# Patient Record
Sex: Male | Born: 1995 | Marital: Single | Smoking: Never smoker
Health system: Southern US, Community
[De-identification: ages and names within clinical notes are randomized; demographics above are authoritative.]

## PROBLEM LIST (undated history)

## (undated) DIAGNOSIS — Q8783 Bardet-Biedl syndrome: Secondary | ICD-10-CM

## (undated) DIAGNOSIS — F819 Developmental disorder of scholastic skills, unspecified: Secondary | ICD-10-CM

## (undated) DIAGNOSIS — F419 Anxiety disorder, unspecified: Secondary | ICD-10-CM

## (undated) DIAGNOSIS — F959 Tic disorder, unspecified: Secondary | ICD-10-CM

## (undated) DIAGNOSIS — Q8789 Other specified congenital malformation syndromes, not elsewhere classified: Secondary | ICD-10-CM

## (undated) DIAGNOSIS — E669 Obesity, unspecified: Secondary | ICD-10-CM

## (undated) HISTORY — PX: HERNIA REPAIR: SHX51

## (undated) HISTORY — DX: Tic disorder, unspecified: F95.9

## (undated) HISTORY — DX: Obesity, unspecified: E66.9

## (undated) HISTORY — DX: Anxiety disorder, unspecified: F41.9

## (undated) HISTORY — DX: Bardet-Biedl syndrome: Q87.83

## (undated) HISTORY — DX: Other specified congenital malformation syndromes, not elsewhere classified: Q87.89

## (undated) HISTORY — DX: Developmental disorder of scholastic skills, unspecified: F81.9

---
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
## 2013-01-17 ENCOUNTER — Encounter (HOSPITAL_COMMUNITY): Payer: Self-pay | Admitting: Emergency Medicine

## 2013-01-17 ENCOUNTER — Emergency Department (HOSPITAL_COMMUNITY)
Admission: EM | Admit: 2013-01-17 | Discharge: 2013-01-17 | Disposition: A | Discharge status: Home / Self Care | Payer: Medicaid Other | Attending: Emergency Medicine | Admitting: Emergency Medicine

## 2013-01-17 DIAGNOSIS — E669 Obesity, unspecified: Secondary | ICD-10-CM | POA: Insufficient documentation

## 2013-01-17 DIAGNOSIS — Z79899 Other long term (current) drug therapy: Secondary | ICD-10-CM | POA: Insufficient documentation

## 2013-01-17 DIAGNOSIS — L259 Unspecified contact dermatitis, unspecified cause: Secondary | ICD-10-CM | POA: Insufficient documentation

## 2013-01-17 DIAGNOSIS — F819 Developmental disorder of scholastic skills, unspecified: Secondary | ICD-10-CM | POA: Insufficient documentation

## 2013-01-17 DIAGNOSIS — Z880 Allergy status to penicillin: Secondary | ICD-10-CM | POA: Insufficient documentation

## 2013-01-17 DIAGNOSIS — R011 Cardiac murmur, unspecified: Secondary | ICD-10-CM | POA: Insufficient documentation

## 2013-01-17 DIAGNOSIS — F411 Generalized anxiety disorder: Secondary | ICD-10-CM | POA: Insufficient documentation

## 2013-01-17 MED ORDER — PREDNISOLONE SODIUM PHOSPHATE 15 MG/5ML PO SOLN: 40.0000 mg | Freq: Every day | ORAL | Status: AC

## 2013-01-17 MED ORDER — DIPHENHYDRAMINE HCL 12.5 MG/5ML PO SYRP: 25.0000 mg | ORAL_SOLUTION | Freq: Four times a day (QID) | ORAL | Status: DC | PRN

## 2013-01-17 NOTE — ED Notes (Signed)
Grandmother reports that she noted rash, blisters and red raised spots on lower legs 3 days ago. Rash not responsive to benadryl or calamine

## 2013-01-17 NOTE — ED Provider Notes (Signed)
CSN: [id number]     Arrival date & time 01/17/13  1351 History   First MD Initiated Contact with Patient 01/17/13 1410     Chief Complaint  Patient presents with  . Rash    rash on both legs from knees to feet. Exposure to poison oak   (Consider location/radiation/quality/duration/timing/severity/associated sxs/prior Treatment) HPI Comments: Patient with pruritic rash over bilateral feet and lower extremities that began after multiple days of clearing bleeds from land and camping in the woods. Grandmother has placed calamine lotion over the rash without relief. She has given the patient Benadryl once last night without relief. Grandmother brought patient in today concerned because she saw a blister, which she has never had before with poison oak. Denies fevers, chills, itching or swelling in the mouth and throat, shortness of breath, difficulty swallowing, drainage from the wound  Patient is a 17 y.o. male presenting with rash. The history is provided by the patient and a caregiver.  Rash Associated symptoms: no abdominal pain, no fever, no nausea, no shortness of breath, no sore throat and not vomiting     Past Medical History  Diagnosis Date  . Obesity   . Anxiety disorder   . Tic disorder   . Problems with learning   . Premature birth    Past Surgical History  Procedure Laterality Date  . Hernia repair     Family History  Problem Relation Age of Onset  . Heart disease Father     WPW  . Heart disease Mother     SVT   History  Substance Use Topics  . Smoking status: Never Smoker   . Smokeless tobacco: Not on file  . Alcohol Use: No    Review of Systems  Constitutional: Negative for fever and chills.  HENT: Negative for sore throat, mouth sores and trouble swallowing.   Respiratory: Negative for shortness of breath.   Gastrointestinal: Negative for nausea, vomiting and abdominal pain.  Skin: Positive for rash. Negative for color change.    Allergies  Penicillins and  Sulfa antibiotics  Home Medications   Current Outpatient Rx  Name  Route  Sig  Dispense  Refill  . atomoxetine (STRATTERA) 40 MG capsule   Oral   Take 40 mg by mouth 2 (two) times daily.         Marland Kitchen loratadine (CLARITIN) 5 MG chewable tablet   Oral   Chew 5 mg by mouth daily.          BP 127/64  Pulse 58  Temp(Src) 98.7 F (37.1 C) (Oral)  Resp 17  SpO2 100% Physical Exam  Nursing note and vitals reviewed. Constitutional: He appears well-developed and well-nourished. No distress.  HENT:  Head: Normocephalic and atraumatic.  Mouth/Throat: Oropharynx is clear and moist.  Neck: Neck supple.  Cardiovascular: Normal rate and regular rhythm.   Murmur heard. Pulmonary/Chest: Effort normal and breath sounds normal. No stridor. No respiratory distress. He has no wheezes. He has no rales.  Neurological: He is alert.  Skin: Rash noted. He is not diaphoretic.  Excoriated papular rash over bilateral feet most densely and also sparsely over her bilateral legs. 1 intact blister over her right dorsal foot. Lesions are nontender. There is no erythema, edema, warmth, or discharge    ED Course  Procedures (including critical care time) Labs Review Labs Reviewed - No data to display Imaging Review No results found.  MDM   1. Contact dermatitis    Patient with contact dermatitis likely from  exposure to plant. Discharged home with steroids and Benadryl. Patient is unable to swallow pills, therefore liquid has been prescribed. Grandmother advised to keep blister intact in to trauma the skin is blister ruptured spontaneously do to increase risk of infection. Discussed findings, treatment, and follow up  with patient and grandmother.  Pt given return precautions.  Grandmotehr verbalizes understanding and agrees with plan.        Trixie Dredge, PA-C 01/17/13 1437

## 2013-01-18 NOTE — ED Provider Notes (Signed)
Medical screening examination/treatment/procedure(s) were performed by non-physician practitioner and as supervising physician I was immediately available for consultation/collaboration.  Claudia Alvizo, MD 01/18/13 1643 

## 2013-03-09 ENCOUNTER — Encounter (HOSPITAL_COMMUNITY): Payer: Self-pay | Admitting: Emergency Medicine

## 2013-03-09 ENCOUNTER — Emergency Department (HOSPITAL_COMMUNITY)
Admission: EM | Admit: 2013-03-09 | Discharge: 2013-03-09 | Disposition: A | Discharge status: Home / Self Care | Payer: Medicaid Other | Attending: Emergency Medicine | Admitting: Emergency Medicine

## 2013-03-09 DIAGNOSIS — E669 Obesity, unspecified: Secondary | ICD-10-CM | POA: Insufficient documentation

## 2013-03-09 DIAGNOSIS — Z880 Allergy status to penicillin: Secondary | ICD-10-CM | POA: Insufficient documentation

## 2013-03-09 DIAGNOSIS — F411 Generalized anxiety disorder: Secondary | ICD-10-CM | POA: Insufficient documentation

## 2013-03-09 DIAGNOSIS — F39 Unspecified mood [affective] disorder: Secondary | ICD-10-CM | POA: Insufficient documentation

## 2013-03-09 DIAGNOSIS — Z79899 Other long term (current) drug therapy: Secondary | ICD-10-CM | POA: Insufficient documentation
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
## 2014-05-29 ENCOUNTER — Encounter (HOSPITAL_COMMUNITY): Payer: Self-pay | Admitting: Emergency Medicine

## 2014-05-29 ENCOUNTER — Emergency Department (INDEPENDENT_AMBULATORY_CARE_PROVIDER_SITE_OTHER)
Admission: EM | Admit: 2014-05-29 | Discharge: 2014-05-29 | Disposition: A | Discharge status: Home / Self Care | Payer: Medicaid Other | Source: Home / Self Care | Attending: Emergency Medicine | Admitting: Emergency Medicine

## 2014-05-29 ENCOUNTER — Emergency Department (INDEPENDENT_AMBULATORY_CARE_PROVIDER_SITE_OTHER): Payer: Medicaid Other

## 2014-05-29 DIAGNOSIS — T148 Other injury of unspecified body region: Secondary | ICD-10-CM

## 2014-05-29 DIAGNOSIS — T148XXA Other injury of unspecified body region, initial encounter: Secondary | ICD-10-CM

## 2014-05-29 DIAGNOSIS — S91332A Puncture wound without foreign body, left foot, initial encounter: Secondary | ICD-10-CM

## 2014-05-29 DIAGNOSIS — Z23 Encounter for immunization: Secondary | ICD-10-CM

## 2014-05-29 MED ORDER — TETANUS-DIPHTH-ACELL PERTUSSIS 5-2.5-18.5 LF-MCG/0.5 IM SUSP
0.5000 mL | Freq: Once | INTRAMUSCULAR | Status: AC
  Administered 2014-05-29: 0.5 mL via INTRAMUSCULAR

## 2014-05-29 MED ORDER — TETANUS-DIPHTH-ACELL PERTUSSIS 5-2.5-18.5 LF-MCG/0.5 IM SUSP
INTRAMUSCULAR | Status: AC
  Filled 2014-05-29: qty 0.5

## 2014-05-29 NOTE — ED Notes (Signed)
Pt states that he stepped on a nail and it went thru his boot on 05/28/2014. He is here because he doesn't know his last tetanus shot

## 2014-05-29 NOTE — Discharge Instructions (Signed)
Keep area clean and dry. Xrays normal. Monitor closely for signs of infection.  If you develop redness, swelling or drainage, please return for re-evaluation Puncture Wound A puncture wound is an injury that extends through all layers of the skin and into the tissue beneath the skin (subcutaneous tissue). Puncture wounds become infected easily because germs often enter the body and go beneath the skin during the injury. Having a deep wound with a small entrance point makes it difficult for your caregiver to adequately clean the wound. This is especially true if you have stepped on a nail and it has passed through a dirty shoe or other situations where the wound is obviously contaminated. CAUSES  Many puncture wounds involve glass, nails, splinters, fish hooks, or other objects that enter the skin (foreign bodies). A puncture wound may also be caused by a human bite or animal bite. DIAGNOSIS  A puncture wound is usually diagnosed by your history and a physical exam. You may need to have an X-ray or an ultrasound to check for any foreign bodies still in the wound. TREATMENT   Your caregiver will clean the wound as thoroughly as possible. Depending on the location of the wound, a bandage (dressing) may be applied.  Your caregiver might prescribe antibiotic medicines.  You may need a follow-up visit to check on your wound. Follow all instructions as directed by your caregiver. HOME CARE INSTRUCTIONS   Change your dressing once per day, or as directed by your caregiver. If the dressing sticks, it may be removed by soaking the area in water.  If your caregiver has given you follow-up instructions, it is very important that you return for a follow-up appointment. Not following up as directed could result in a chronic or permanent injury, pain, and disability.  Only take over-the-counter or prescription medicines for pain, discomfort, or fever as directed by your caregiver.  If you are given  antibiotics, take them as directed. Finish them even if you start to feel better. You may need a tetanus shot if:  You cannot remember when you had your last tetanus shot.  You have never had a tetanus shot. If you got a tetanus shot, your arm may swell, get red, and feel warm to the touch. This is common and not a problem. If you need a tetanus shot and you choose not to have one, there is a rare chance of getting tetanus. Sickness from tetanus can be serious. You may need a rabies shot if an animal bite caused your puncture wound. SEEK MEDICAL CARE IF:   You have redness, swelling, or increasing pain in the wound.  You have red streaks going away from the wound.  You notice a bad smell coming from the wound or dressing.  You have yellowish-white fluid (pus) coming from the wound.  You are treated with an antibiotic for infection, but the infection is not getting better.  You notice something in the wound, such as rubber from your shoe, cloth, or another object.  You have a fever.  You have severe pain.  You have difficulty breathing.  You feel dizzy or faint.  You cannot stop vomiting.  You lose feeling, develop numbness, or cannot move a limb below the wound.  Your symptoms worsen. MAKE SURE YOU:  Understand these instructions.  Will watch your condition.  Will get help right away if you are not doing well or get worse. Document Released: 02/10/2005 Document Revised: 07/26/2011 Document Reviewed: 10/20/2010 Exit[employer] Patient Information 2015  [employer]. This information is not intended to replace advice given to you by your health care provider. Make sure you discuss any questions you have with your health care provider.

## 2014-05-29 NOTE — ED Provider Notes (Signed)
CSN: [id number]     Arrival date & time 05/29/14  1335 History   First MD Initiated Contact with Patient 05/29/14 1352     Chief Complaint  Patient presents with  . Foot Injury   (Consider location/radiation/quality/duration/timing/severity/associated sxs/prior Treatment) HPI Comments: Patient states he was walking outdoors with boots on yesterday evening and stepped on a nail that went through the sole of his boot and caused a small puncture wound on the plantar surface of the foot at the base of his left great toe. Last tetanus booster: unknown  Patient is a 19 y.o. male presenting with foot injury. The history is provided by the patient and a relative.  Foot Injury Location:  Foot Time since incident:  1 day Injury: yes   Foot location:  Sole of L foot Tetanus status:  Out of date Prior injury to area:  No   Past Medical History  Diagnosis Date  . Obesity   . Anxiety disorder   . Tic disorder   . Problems with learning   . Premature birth    Past Surgical History  Procedure Laterality Date  . Hernia repair     Family History  Problem Relation Age of Onset  . Heart disease Father     WPW  . Heart disease Mother     SVT   History  Substance Use Topics  . Smoking status: Never Smoker   . Smokeless tobacco: Not on file  . Alcohol Use: No    Review of Systems  All other systems reviewed and are negative.   Allergies  Penicillins and Sulfa antibiotics  Home Medications   Prior to Admission medications   Medication Sig Start Date End Date Taking? Authorizing Provider  ALPRAZolam (XANAX) 0.25 MG tablet Take 0.25 mg by mouth 2 (two) times daily.   Yes Historical Provider, MD  ARIPiprazole (ABILIFY) 5 MG tablet Take 5 mg by mouth daily.   Yes Historical Provider, MD  loratadine (CLARITIN) 5 MG chewable tablet Chew 5 mg by mouth daily.   Yes Historical Provider, MD  traZODone (DESYREL) 50 MG tablet Take 50 mg by mouth at bedtime.   Yes Historical Provider, MD   dexmethylphenidate (FOCALIN XR) 5 MG 24 hr capsule Take 5 mg by mouth daily.    Historical Provider, MD   BP 151/66 mmHg  Pulse 77  Temp(Src) 97.8 F (36.6 C) (Oral)  Resp 18  SpO2 99% Physical Exam  Constitutional: He is oriented to person, place, and time. He appears well-developed and well-nourished. No distress.  HENT:  Head: Normocephalic and atraumatic.  Cardiovascular: Normal rate.   Pulmonary/Chest: Effort normal.  Musculoskeletal: Normal range of motion.  +small superficial puncture wound at plantar surface of left foot at base of left great toe.   Neurological: He is alert and oriented to person, place, and time.  Psychiatric: His speech is normal and behavior is normal. His affect is blunt.  Nursing note and vitals reviewed.   ED Course  Procedures (including critical care time) Labs Review Labs Reviewed - No data to display  Imaging Review Dg Foot Complete Left  05/29/2014   CLINICAL DATA:  Stepped on nail yesterday. Wound is plantar at the base of the great toe.  EXAM: LEFT FOOT - COMPLETE 3+ VIEW  COMPARISON:  None.  FINDINGS: There is no evidence of fracture or dislocation. There is no evidence of arthropathy or other focal bone abnormality. Soft tissues are unremarkable.  IMPRESSION: Negative.   Electronically Signed  By: Signa Kell M.D.   On: 05/29/2014 15:06     MDM   1. Puncture wound of foot, left, initial encounter   2. Puncture wound   Films unremarkable Patient received Tdap booster while at [facility] No clinical evidence of retained FB or soft tissue infection. Will advise family regarding wound care and signs/symptoms of infection along with reasons for return.     Ria Clock, [place] 05/29/14 1515

## 2014-10-28 ENCOUNTER — Emergency Department (HOSPITAL_COMMUNITY)
Admission: EM | Admit: 2014-10-28 | Discharge: 2014-10-31 | Disposition: A | Discharge status: Home / Self Care | Payer: Medicaid Other | Attending: Emergency Medicine | Admitting: Emergency Medicine

## 2014-10-28 ENCOUNTER — Encounter (HOSPITAL_COMMUNITY): Payer: Self-pay

## 2014-10-28 ENCOUNTER — Ambulatory Visit (HOSPITAL_COMMUNITY)
Admission: RE | Admit: 2014-10-28 | Discharge: 2014-10-28 | Disposition: A | Discharge status: Home / Self Care | Payer: Medicaid Other | Attending: Psychiatry | Admitting: Psychiatry

## 2014-10-28 DIAGNOSIS — F911 Conduct disorder, childhood-onset type: Secondary | ICD-10-CM | POA: Diagnosis not present

## 2014-10-28 DIAGNOSIS — E669 Obesity, unspecified: Secondary | ICD-10-CM | POA: Insufficient documentation

## 2014-10-28 DIAGNOSIS — F1310 Sedative, hypnotic or anxiolytic abuse, uncomplicated: Secondary | ICD-10-CM | POA: Insufficient documentation

## 2014-10-28 DIAGNOSIS — R45850 Homicidal ideations: Secondary | ICD-10-CM

## 2014-10-28 DIAGNOSIS — F88 Other disorders of psychological development: Secondary | ICD-10-CM | POA: Insufficient documentation

## 2014-10-28 DIAGNOSIS — Z79899 Other long term (current) drug therapy: Secondary | ICD-10-CM | POA: Diagnosis not present

## 2014-10-28 DIAGNOSIS — F819 Developmental disorder of scholastic skills, unspecified: Secondary | ICD-10-CM

## 2014-10-28 DIAGNOSIS — Z880 Allergy status to penicillin: Secondary | ICD-10-CM | POA: Insufficient documentation

## 2014-10-28 DIAGNOSIS — F333 Major depressive disorder, recurrent, severe with psychotic symptoms: Secondary | ICD-10-CM

## 2014-10-28 DIAGNOSIS — F411 Generalized anxiety disorder: Secondary | ICD-10-CM | POA: Diagnosis not present

## 2014-10-28 DIAGNOSIS — R4689 Other symptoms and signs involving appearance and behavior: Secondary | ICD-10-CM

## 2014-10-28 DIAGNOSIS — R45851 Suicidal ideations: Secondary | ICD-10-CM | POA: Diagnosis present

## 2014-10-28 LAB — CBC
HEMATOCRIT: 42.3 % (ref 39.0–52.0)
HEMOGLOBIN: 14.6 g/dL (ref 13.0–17.0)
MCH: 30.5 pg (ref 26.0–34.0)
MCHC: 34.5 g/dL (ref 30.0–36.0)
MCV: 88.5 fL (ref 78.0–100.0)
Platelets: 230 10*3/uL (ref 150–400)
RBC: 4.78 MIL/uL (ref 4.22–5.81)
RDW: 12.6 % (ref 11.5–15.5)
WBC: 9.5 10*3/uL (ref 4.0–10.5)

## 2014-10-28 LAB — COMPREHENSIVE METABOLIC PANEL
ALBUMIN: 4.6 g/dL (ref 3.5–5.0)
ALT: 96 U/L — ABNORMAL HIGH (ref 17–63)
ANION GAP: 8 (ref 5–15)
AST: 48 U/L — ABNORMAL HIGH (ref 15–41)
Alkaline Phosphatase: 94 U/L (ref 38–126)
BILIRUBIN TOTAL: 0.7 mg/dL (ref 0.3–1.2)
BUN: 9 mg/dL (ref 6–20)
CALCIUM: 9.4 mg/dL (ref 8.9–10.3)
CHLORIDE: 106 mmol/L (ref 101–111)
CO2: 26 mmol/L (ref 22–32)
CREATININE: 0.95 mg/dL (ref 0.61–1.24)
GFR calc Af Amer: >60 mL/min (ref >60)
GFR calc non Af Amer: >60 mL/min (ref >60)
Glucose, Bld: 104 mg/dL — ABNORMAL HIGH (ref 65–99)
POTASSIUM: 3.5 mmol/L (ref 3.5–5.1)
SODIUM: 140 mmol/L (ref 135–145)
Total Protein: 7.3 g/dL (ref 6.5–8.1)

## 2014-10-28 LAB — RAPID URINE DRUG SCREEN, HOSP PERFORMED
AMPHETAMINES: NOT DETECTED
BENZODIAZEPINES: POSITIVE — AB
Barbiturates: NOT DETECTED
Cocaine: NOT DETECTED
OPIATES: NOT DETECTED
Tetrahydrocannabinol: NOT DETECTED

## 2014-10-28 LAB — ACETAMINOPHEN LEVEL

## 2014-10-28 LAB — SALICYLATE LEVEL: Salicylate Lvl: <4 mg/dL (ref 2.8–30.0)

## 2014-10-28 LAB — ETHANOL: Alcohol, Ethyl (B): <5 mg/dL (ref <5)

## 2014-10-28 NOTE — ED Notes (Signed)
Pt was brought to the ER by a [facility] tech and it was told that he DEFINITELY had a bed for him at [facility], the family is also under the assumption that was the case, when talking to the [facility] and the assessment team at [facility], he was never approved for a bed at [facility] because he has MR and they are looking for placement for him. Charge RN spoke to the [facility] and it was discussed that it was also inappropriate for him to be in the ER and why we were told that he had a bed available.
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
## 2014-10-28 NOTE — ED Notes (Signed)
Pt belongings are with his family members

## 2014-10-28 NOTE — ED Notes (Signed)
Pt visiting with family before coming back to [facility]

## 2014-10-28 NOTE — Progress Notes (Addendum)
Patient was referred at: [facility] - left voicemail. [facility] - left voicemail. [facility] - per Kenel, if pt's IQ is over 60 or if pt is high functioning, patient can be referred.  At capacity: [facility] - per Vernon Prey spoke with patient's grandmother Mellody Memos inquiring if pt's MR/IQ score can be provided.  Per pt's grandmother, she got home and opened the psychological assessment but has not been able to find the MR number.  Pt's grandmother stated that she will call back Clinical research associate at [facility].  This Clinical research associate awaiting call back from pt's grandmother. Writer left a note for the am CSW that family member will be calling to leave patient's MR score.  CSW will continue to follow up.  Melbourne Abts, LCSWA Disposition staff 10/28/2014 10:36 PM

## 2014-10-28 NOTE — ED Notes (Signed)
Pt needs to be medically cleared for [facility], he has a ready bed there, pt has anger issues and is SI/HI

## 2014-10-28 NOTE — ED Provider Notes (Signed)
CSN: [id number]     Arrival date & time 10/28/14  1948 History   First MD Initiated Contact with Patient 10/28/14 2043     Chief Complaint  Patient presents with  . Suicidal     (Consider location/radiation/quality/duration/timing/severity/associated sxs/prior Treatment) HPI The patient is sent for medical clearance for behavioral health admission. The patient reports he has anger problems and sometimes loses his temper and comes close to hurting people. Family members are present with him and reports that he at times become violent and aggressive and lacks control. The patient endorses at times thinking of hurting others and or himself. Family members present state he has not been medically unwell. Past Medical History  Diagnosis Date  . Obesity   . Anxiety disorder   . Tic disorder   . Problems with learning   . Premature birth    Past Surgical History  Procedure Laterality Date  . Hernia repair     Family History  Problem Relation Age of Onset  . Heart disease Father     WPW  . Heart disease Mother     SVT   History  Substance Use Topics  . Smoking status: Never Smoker   . Smokeless tobacco: Not on file  . Alcohol Use: No    Review of Systems 10 Systems reviewed and are negative for acute change except as noted in the HPI.    Allergies  Penicillins and Sulfa antibiotics  Home Medications   Prior to Admission medications   Medication Sig Start Date End Date Taking? Authorizing Provider  ALPRAZolam Prudy Feeler) 0.5 MG tablet Take 0.5 mg by mouth 2 (two) times daily.   Yes Historical Provider, MD  ARIPiprazole (ABILIFY) 10 MG tablet Take 10 mg by mouth daily.   Yes Historical Provider, MD  DHA-EPA-VITAMIN E PO Take 1,000 mg by mouth daily.   Yes Historical Provider, MD  loratadine (CLARITIN) 10 MG tablet Take 10 mg by mouth daily.   Yes Historical Provider, MD  traZODone (DESYREL) 50 MG tablet Take 50 mg by mouth at bedtime.   Yes Historical Provider, MD   BP 127/59  mmHg  Pulse 67  Temp(Src) 98.2 F (36.8 C) (Oral)  Resp 20  SpO2 99% Physical Exam  Constitutional: He is oriented to person, place, and time. He appears well-developed and well-nourished.  HENT:  Head: Normocephalic and atraumatic.  Eyes: EOM are normal. Pupils are equal, round, and reactive to light.  Neck: Neck supple.  Cardiovascular: Normal rate, regular rhythm, normal heart sounds and intact distal pulses.   Pulmonary/Chest: Effort normal and breath sounds normal.  Abdominal: Soft. Bowel sounds are normal. He exhibits no distension. There is no tenderness.  Musculoskeletal: Normal range of motion. He exhibits no edema.  Neurological: He is alert and oriented to person, place, and time. He has normal strength. Coordination normal. GCS eye subscore is 4. GCS verbal subscore is 5. GCS motor subscore is 6.  Skin: Skin is warm, dry and intact.  Psychiatric:  Patient's affect is flat but cooperative.    ED Course  Procedures (including critical care time) Labs Review Labs Reviewed  ACETAMINOPHEN LEVEL - Abnormal; Notable for the following:    Acetaminophen (Tylenol), Serum <10 (*)    All other components within normal limits  COMPREHENSIVE METABOLIC PANEL - Abnormal; Notable for the following:    Glucose, Bld 104 (*)    AST 48 (*)    ALT 96 (*)    All other components within normal limits  URINE RAPID DRUG SCREEN, HOSP PERFORMED - Abnormal; Notable for the following:    Benzodiazepines POSITIVE (*)    All other components within normal limits  CBC  ETHANOL  SALICYLATE LEVEL    Imaging Review No results found.   EKG Interpretation None      MDM   Final diagnoses:  Homicidal ideation  Aggressive behavior  Cognitive developmental delay   The patient is medically cleared for psychiatric admission. He and family members deny any recent illness or any localizing physical complaints. The patient does have behavioral\cognitive delays and has had escalating behaviors  that are threatening to others as well as ideations of self-harm.    Arby Barrette, MD 10/29/14 [phone]

## 2014-10-28 NOTE — ED Notes (Signed)
Bed: WBH36 Expected date:  Expected time:  Means of arrival:  Comments: Triage 4 
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
## 2014-10-29 DIAGNOSIS — F333 Major depressive disorder, recurrent, severe with psychotic symptoms: Secondary | ICD-10-CM

## 2014-10-29 MED ORDER — LORATADINE 10 MG PO TABS
10.0000 mg | ORAL_TABLET | Freq: Every day | ORAL | Status: DC
  Administered 2014-10-29 – 2014-10-31 (×3): 10 mg via ORAL
  Filled 2014-10-29 (×3): qty 1

## 2014-10-29 MED ORDER — TRAZODONE HCL 50 MG PO TABS
50.0000 mg | ORAL_TABLET | Freq: Every day | ORAL | Status: DC
  Administered 2014-10-29 – 2014-10-30 (×2): 50 mg via ORAL
  Filled 2014-10-29 (×2): qty 1

## 2014-10-29 MED ORDER — ARIPIPRAZOLE 10 MG PO TABS
10.0000 mg | ORAL_TABLET | Freq: Every day | ORAL | Status: DC
Start: 2014-10-29 — End: 2014-10-31
  Administered 2014-10-29 – 2014-10-31 (×3): 10 mg via ORAL
  Filled 2014-10-29 (×3): qty 1

## 2014-10-29 MED ORDER — ALPRAZOLAM 0.5 MG PO TABS
0.5000 mg | ORAL_TABLET | Freq: Two times a day (BID) | ORAL | Status: DC
  Administered 2014-10-29 – 2014-10-31 (×5): 0.5 mg via ORAL
  Filled 2014-10-29 (×5): qty 1

## 2014-10-29 NOTE — Progress Notes (Signed)
At this time, pt does not have any formal diagnosis for MR. Pt last developmental diagnostic assessment was at age 19 with no full scale IQ.   Olga Coaster, LCSW  Clinical Social Work  [employer] [phone]

## 2014-10-29 NOTE — Progress Notes (Addendum)
Patient referral was followed up at: [facility] - per Alcario Drought, referral is still in stack of referrals, - will give you a call if there is an appropriate bed available for patient. [facility] - left voicemail.  [facility] - per Mickeal Skinner, can't take pt's insurance.  Patient has been referred to: [facility], and [facility].  Melbourne Abts, LCSWA Disposition staff 10/29/2014 9:23 PM

## 2014-10-29 NOTE — Progress Notes (Signed)
CSW met with pt and mom at bedside. Patient informed CSW that he does not have any questions at this time.  Mom states that she and the pt's grandmother would like to speak with CSW. CSW met with mom and grandmother. Grandmother is currently the guardian of pt. She states that she has been the guardian of the pt for a year. Patient currently lives with grandmom. Grandmother states that the pt's most recent psychological evaluation was done last year at [facility]. Grandmother allowed CSW to copy the psychological.  Grandmother informed CSW that the pt cannot count money, read very well, and that he is also not able to got to stores alone. She states that the pt must have someone with him at all time. Grandmother informed CSW that the pt has poor eye sight and is going blind.6  Grandmother informed CSW that she does not want the pt to be hospitalized for inpatient treatment outside of [place] or [facility].   Willette Brace [phone] ED CSW 10/29/2014 10:53 PM

## 2014-10-29 NOTE — ED Notes (Signed)
Pt AAO x 3, no distress noted, calm & cooperative, visiting with family at present.  Monitoring for safety, Q 15 min checks in effect at present.

## 2014-10-29 NOTE — Consult Note (Signed)
[facility] Consult   Reason for Consult:  Severe Recurrent Major depression with Psychosis, Agitation, Aggression Referring Physician:  EDP Patient Identification: Jerry Casey. MRN:  [id number] Principal Diagnosis: Severe recurrent major depressive disorder with psychotic features Diagnosis:   Patient Active Problem List   Diagnosis Date Noted  . Severe recurrent major depressive disorder with psychotic features [F33.3] 10/29/2014    Priority: High  . Palpitations [R00.2] 06/23/2011  . Chest pain [786.5] 06/23/2011    Total Time spent with patient: 1 hour  Subjective:   Jerry Casey. is a 19 y.o. male patient admitted with Severe Recurrent Major depression with Psychosis, Agitation, Aggression.  HPI:  Caucasian male, 19 years old was brought in by his mother and grand mother for anger and agitation.  Patient reports that he hears voices calling him bad names.  Patient reports that he was angry ad stated that he wanted to kill himself.   He reports that his anger increases when people around him make fun of him and calls him names.  Patient, it documented want to kill his father for not wanting a relationship with him.  Patient reports poor sleep and appetite.  He denies SI/HIVH but reports auditory hallucination with the voices calling him bad names.  He also reports Paranoia suspecting people are out to get him.   Patient is accepted for admission and we will be seeking placement at any facility with available inpatient Psychiatric unit.  HPI Elements:   Location:  Major depressive disorder, recurrent with Psychosis. Quality:  severe. Severity:  severe. Timing:  acute. Duration:  sudden. Context:  Brought in by [name] mother and his mother for agitataion.  Past Medical History:  Past Medical History  Diagnosis Date  . Obesity   . Anxiety disorder   . Tic disorder   . Problems with learning   . Premature birth     Past Surgical History  Procedure Laterality  Date  . Hernia repair     Family History:  Family History  Problem Relation Age of Onset  . Heart disease Father     WPW  . Heart disease Mother     SVT   Social History:  History  Alcohol Use No     History  Drug Use Not on file    History   Social History  . Marital Status: Single    Spouse Name: N/A  . Number of Children: N/A  . Years of Education: N/A   Occupational History  .      Student   Social History Main Topics  . Smoking status: Never Smoker   . Smokeless tobacco: Not on file  . Alcohol Use: No  . Drug Use: Not on file  . Sexual Activity: Not on file   Other Topics Concern  . None   Social History Narrative   Additional Social History:                          Allergies:   Allergies  Allergen Reactions  . Penicillins Hives  . Sulfa Antibiotics Hives    Labs:  Results for orders placed or performed during the hospital encounter of 10/28/14 (from the past 48 hour(s))  Acetaminophen level     Status: Abnormal   Collection Time: 10/28/14  9:21 PM  Result Value Ref Range   Acetaminophen (Tylenol), Serum <10 (L) 10 - 30 ug/mL    Comment:  THERAPEUTIC CONCENTRATIONS VARY SIGNIFICANTLY. A RANGE OF 10-30 ug/mL MAY BE AN EFFECTIVE CONCENTRATION FOR MANY PATIENTS. HOWEVER, SOME ARE BEST TREATED AT CONCENTRATIONS OUTSIDE THIS RANGE. ACETAMINOPHEN CONCENTRATIONS >150 ug/mL AT 4 HOURS AFTER INGESTION AND >50 ug/mL AT 12 HOURS AFTER INGESTION ARE OFTEN ASSOCIATED WITH TOXIC REACTIONS.   CBC     Status: None   Collection Time: 10/28/14  9:21 PM  Result Value Ref Range   WBC 9.5 4.0 - 10.5 K/uL   RBC 4.78 4.22 - 5.81 MIL/uL   Hemoglobin 14.6 13.0 - 17.0 g/dL   HCT 59.9 23.4 - 14.4 %   MCV 88.5 78.0 - 100.0 fL   MCH 30.5 26.0 - 34.0 pg   MCHC 34.5 30.0 - 36.0 g/dL   RDW 36.0 16.5 - 80.0 %   Platelets 230 150 - 400 K/uL  Comprehensive metabolic panel     Status: Abnormal   Collection Time: 10/28/14  9:21 PM  Result Value  Ref Range   Sodium 140 135 - 145 mmol/L   Potassium 3.5 3.5 - 5.1 mmol/L   Chloride 106 101 - 111 mmol/L   CO2 26 22 - 32 mmol/L   Glucose, Bld 104 (H) 65 - 99 mg/dL   BUN 9 6 - 20 mg/dL   Creatinine, Ser 6.34 0.61 - 1.24 mg/dL   Calcium 9.4 8.9 - 94.9 mg/dL   Total Protein 7.3 6.5 - 8.1 g/dL   Albumin 4.6 3.5 - 5.0 g/dL   AST 48 (H) 15 - 41 U/L   ALT 96 (H) 17 - 63 U/L   Alkaline Phosphatase 94 38 - 126 U/L   Total Bilirubin 0.7 0.3 - 1.2 mg/dL   GFR calc non Af Amer >60 >60 mL/min   GFR calc Af Amer >60 >60 mL/min    Comment: (NOTE) The eGFR has been calculated using the CKD EPI equation. This calculation has not been validated in all clinical situations. eGFR's persistently <60 mL/min signify possible Chronic Kidney Disease.    Anion gap 8 5 - 15  Ethanol (ETOH)     Status: None   Collection Time: 10/28/14  9:21 PM  Result Value Ref Range   Alcohol, Ethyl (B) <5 <5 mg/dL    Comment:        LOWEST DETECTABLE LIMIT FOR SERUM ALCOHOL IS 5 mg/dL FOR MEDICAL PURPOSES ONLY   Salicylate level     Status: None   Collection Time: 10/28/14  9:21 PM  Result Value Ref Range   Salicylate Lvl <4.0 2.8 - 30.0 mg/dL  Urine rapid drug screen (hosp performed)not at [facility]     Status: Abnormal   Collection Time: 10/28/14  9:36 PM  Result Value Ref Range   Opiates NONE DETECTED NONE DETECTED   Cocaine NONE DETECTED NONE DETECTED   Benzodiazepines POSITIVE (A) NONE DETECTED   Amphetamines NONE DETECTED NONE DETECTED   Tetrahydrocannabinol NONE DETECTED NONE DETECTED   Barbiturates NONE DETECTED NONE DETECTED    Comment:        DRUG SCREEN FOR MEDICAL PURPOSES ONLY.  IF CONFIRMATION IS NEEDED FOR ANY PURPOSE, NOTIFY LAB WITHIN 5 DAYS.        LOWEST DETECTABLE LIMITS FOR URINE DRUG SCREEN Drug Class       Cutoff (ng/mL) Amphetamine      1000 Barbiturate      200 Benzodiazepine   200 Tricyclics       300 Opiates          300 Cocaine  300 THC              50      Vitals: Blood pressure 127/59, pulse 67, temperature 98.2 F (36.8 C), temperature source Oral, resp. rate 20, SpO2 99 %.  Risk to Self: Is patient at risk for suicide?: Yes Risk to Others:   Prior Inpatient Therapy:   Prior Outpatient Therapy:    Current Facility-Administered Medications  Medication Dose Route Frequency Provider Last Rate Last Dose  . ALPRAZolam (XANAX) tablet 0.5 mg  0.5 mg Oral BID Delfin Gant, NP      . ARIPiprazole (ABILIFY) tablet 10 mg  10 mg Oral Daily Delfin Gant, NP      . loratadine (CLARITIN) tablet 10 mg  10 mg Oral Daily Delfin Gant, NP      . traZODone (DESYREL) tablet 50 mg  50 mg Oral QHS Delfin Gant, NP       Current Outpatient Prescriptions  Medication Sig Dispense Refill  . ALPRAZolam (XANAX) 0.5 MG tablet Take 0.5 mg by mouth 2 (two) times daily.    . ARIPiprazole (ABILIFY) 10 MG tablet Take 10 mg by mouth daily.    . DHA-EPA-VITAMIN E PO Take 1,000 mg by mouth daily.    Marland Kitchen loratadine (CLARITIN) 10 MG tablet Take 10 mg by mouth daily.    . traZODone (DESYREL) 50 MG tablet Take 50 mg by mouth at bedtime.      Musculoskeletal: Strength & Muscle Tone: within normal limits Gait & Station: normal Patient leans: N/A  Psychiatric Specialty Exam: Physical Exam  Review of Systems  Constitutional: Negative.   HENT: Negative.   Eyes: Negative.   Cardiovascular: Negative.   Gastrointestinal: Negative.   Genitourinary: Negative.   Musculoskeletal: Negative.   Skin: Negative.   Neurological: Negative.     Blood pressure 127/59, pulse 67, temperature 98.2 F (36.8 C), temperature source Oral, resp. rate 20, SpO2 99 %.There is no weight on file to calculate BMI.  General Appearance: Casual and Fairly Groomed  Eye Contact::  Good  Speech:  Clear and Coherent and Normal Rate  Volume:  Normal  Mood:  Anxious and Depressed  Affect:  Congruent and Depressed  Thought Process:  Coherent, Goal Directed and Intact   Orientation:  Full (Time, Place, and Person)  Thought Content:  WDL  Suicidal Thoughts:  No  Homicidal Thoughts:  No  Memory:  Immediate;   Good Recent;   Good Remote;   Fair  Judgement:  Poor  Insight:  Shallow  Psychomotor Activity:  Normal  Concentration:  Good  Recall:  NA  Fund of Knowledge:Fair  Language: Good  Akathisia:  NA  Handed:  Right  AIMS (if indicated):     Assets:  Desire for Improvement  ADL's:  Intact  Cognition: WNL  Sleep:      Medical Decision Making: Review of Psycho-Social Stressors (1), Established Problem, Worsening (2), Review of Medication Regimen & Side Effects (2) and Review of New Medication or Change in Dosage (2)  Treatment Plan Summary: Daily contact with patient to assess and evaluate symptoms and progress in treatment and Medication management  Plan:  Recommend psychiatric Inpatient admission when medically cleared.  Resume home medications,. Disposition: [facility]    PMHNP-BC 10/29/2014 1:25 PM Patient seen face-to-face for psychiatric evaluation, chart reviewed and case discussed with the physician extender and developed treatment plan. Reviewed the information documented and agree with the treatment plan. Corena Pilgrim, MD

## 2014-10-29 NOTE — ED Notes (Signed)
Patient is quiet and cooperative.  He has remained in room, only to come out and use the bathroom.

## 2014-10-29 NOTE — Progress Notes (Signed)
Grandmother/ Kendal Hymen [phone] Esmond Camper [phone] ED CSW 10/29/2014 10:57 PM

## 2014-10-30 DIAGNOSIS — R4689 Other symptoms and signs involving appearance and behavior: Secondary | ICD-10-CM | POA: Insufficient documentation

## 2014-10-30 NOTE — ED Notes (Signed)
Pt AAO x 3, no distress noted, family at bedside, pt took shower, comfort measures given.  Pt calm & cooperative at present.  Monitoring for safety, Q 15 min checks in effect.

## 2014-10-30 NOTE — Progress Notes (Signed)
ED CM reviewed SAPPU progression responses with covering SW, S Kidd  

## 2014-10-30 NOTE — Consult Note (Signed)
[facility] Consult   Reason for Consult:  Severe Recurrent Major depression with Psychosis, Agitation, Aggression Referring Physician:  EDP Patient Identification: Jerry Casey. MRN:  [id number] Principal Diagnosis: Severe recurrent major depressive disorder with psychotic features Diagnosis:   Patient Active Problem List   Diagnosis Date Noted  . Severe recurrent major depressive disorder with psychotic features [F33.3] 10/29/2014    Priority: High  . Palpitations [R00.2] 06/23/2011  . Chest pain [786.5] 06/23/2011    Total Time spent with patient: 1 hour  Subjective:   Jerry Casey. is a 19 y.o. male patient admitted with Severe Recurrent Major depression with Psychosis, Agitation, Aggression.  HPI:  Caucasian male, 19 years old was brought in by his mother and grand mother for anger and agitation.  Patient reports that he hears voices calling him bad names.  Patient reports that he was angry ad stated that he wanted to kill himself.   He reports that his anger increases when people around him make fun of him and calls him names.  Patient, it documented want to kill his father for not wanting a relationship with him.  Patient reports poor sleep and appetite.  He denies SI/HIVH but reports auditory hallucination with the voices calling him bad names.  He also reports Paranoia suspecting people are out to get him.   Patient is accepted for admission and we will be seeking placement at any facility with available inpatient Psychiatric unit.  Patient denies SI/HI [facility] but still is having auditory hallucination with voices calling him "bad names" but did not explain further.  We will continue to seek placement at any inpatient Psychiatric unit with available bed.  HPI Elements:   Location:  Major depressive disorder, recurrent with Psychosis. Quality:  severe. Severity:  severe. Timing:  acute. Duration:  sudden. Context:  Brought in by [place] mother and his mother for  agitataion.  Past Medical History:  Past Medical History  Diagnosis Date  . Obesity   . Anxiety disorder   . Tic disorder   . Problems with learning   . Premature birth     Past Surgical History  Procedure Laterality Date  . Hernia repair     Family History:  Family History  Problem Relation Age of Onset  . Heart disease Father     WPW  . Heart disease Mother     SVT   Social History:  History  Alcohol Use No     History  Drug Use Not on file    History   Social History  . Marital Status: Single    Spouse Name: N/A  . Number of Children: N/A  . Years of Education: N/A   Occupational History  .      Student   Social History Main Topics  . Smoking status: Never Smoker   . Smokeless tobacco: Not on file  . Alcohol Use: No  . Drug Use: Not on file  . Sexual Activity: Not on file   Other Topics Concern  . None   Social History Narrative   Additional Social History:    Allergies:   Allergies  Allergen Reactions  . Penicillins Hives  . Sulfa Antibiotics Hives    Labs:  Results for orders placed or performed during the hospital encounter of 10/28/14 (from the past 48 hour(s))  Acetaminophen level     Status: Abnormal   Collection Time: 10/28/14  9:21 PM  Result Value Ref Range   Acetaminophen (Tylenol), Serum <  10 (L) 10 - 30 ug/mL    Comment:        THERAPEUTIC CONCENTRATIONS VARY SIGNIFICANTLY. A RANGE OF 10-30 ug/mL MAY BE AN EFFECTIVE CONCENTRATION FOR MANY PATIENTS. HOWEVER, SOME ARE BEST TREATED AT CONCENTRATIONS OUTSIDE THIS RANGE. ACETAMINOPHEN CONCENTRATIONS >150 ug/mL AT 4 HOURS AFTER INGESTION AND >50 ug/mL AT 12 HOURS AFTER INGESTION ARE OFTEN ASSOCIATED WITH TOXIC REACTIONS.   CBC     Status: None   Collection Time: 10/28/14  9:21 PM  Result Value Ref Range   WBC 9.5 4.0 - 10.5 K/uL   RBC 4.78 4.22 - 5.81 MIL/uL   Hemoglobin 14.6 13.0 - 17.0 g/dL   HCT 42.3 39.0 - 52.0 %   MCV 88.5 78.0 - 100.0 fL   MCH 30.5 26.0 - 34.0  pg   MCHC 34.5 30.0 - 36.0 g/dL   RDW 12.6 11.5 - 15.5 %   Platelets 230 150 - 400 K/uL  Comprehensive metabolic panel     Status: Abnormal   Collection Time: 10/28/14  9:21 PM  Result Value Ref Range   Sodium 140 135 - 145 mmol/L   Potassium 3.5 3.5 - 5.1 mmol/L   Chloride 106 101 - 111 mmol/L   CO2 26 22 - 32 mmol/L   Glucose, Bld 104 (H) 65 - 99 mg/dL   BUN 9 6 - 20 mg/dL   Creatinine, Ser 0.95 0.61 - 1.24 mg/dL   Calcium 9.4 8.9 - 10.3 mg/dL   Total Protein 7.3 6.5 - 8.1 g/dL   Albumin 4.6 3.5 - 5.0 g/dL   AST 48 (H) 15 - 41 U/L   ALT 96 (H) 17 - 63 U/L   Alkaline Phosphatase 94 38 - 126 U/L   Total Bilirubin 0.7 0.3 - 1.2 mg/dL   GFR calc non Af Amer >60 >60 mL/min   GFR calc Af Amer >60 >60 mL/min    Comment: (NOTE) The eGFR has been calculated using the CKD EPI equation. This calculation has not been validated in all clinical situations. eGFR's persistently <60 mL/min signify possible Chronic Kidney Disease.    Anion gap 8 5 - 15  Ethanol (ETOH)     Status: None   Collection Time: 10/28/14  9:21 PM  Result Value Ref Range   Alcohol, Ethyl (B) <5 <5 mg/dL    Comment:        LOWEST DETECTABLE LIMIT FOR SERUM ALCOHOL IS 5 mg/dL FOR MEDICAL PURPOSES ONLY   Salicylate level     Status: None   Collection Time: 10/28/14  9:21 PM  Result Value Ref Range   Salicylate Lvl <6.3 2.8 - 30.0 mg/dL  Urine rapid drug screen (hosp performed)not at [facility]     Status: Abnormal   Collection Time: 10/28/14  9:36 PM  Result Value Ref Range   Opiates NONE DETECTED NONE DETECTED   Cocaine NONE DETECTED NONE DETECTED   Benzodiazepines POSITIVE (A) NONE DETECTED   Amphetamines NONE DETECTED NONE DETECTED   Tetrahydrocannabinol NONE DETECTED NONE DETECTED   Barbiturates NONE DETECTED NONE DETECTED    Comment:        DRUG SCREEN FOR MEDICAL PURPOSES ONLY.  IF CONFIRMATION IS NEEDED FOR ANY PURPOSE, NOTIFY LAB WITHIN 5 DAYS.        LOWEST DETECTABLE LIMITS FOR URINE DRUG  SCREEN Drug Class       Cutoff (ng/mL) Amphetamine      1000 Barbiturate      200 Benzodiazepine   016 Tricyclics  300 Opiates          300 Cocaine          300 THC              50     Vitals: Blood pressure 124/59, pulse 60, temperature 97.8 F (36.6 C), temperature source Oral, resp. rate 18, SpO2 100 %.  Risk to Self: Is patient at risk for suicide?: Yes Risk to Others:   Prior Inpatient Therapy:   Prior Outpatient Therapy:    Current Facility-Administered Medications  Medication Dose Route Frequency Provider Last Rate Last Dose  . ALPRAZolam Duanne Moron) tablet 0.5 mg  0.5 mg Oral BID Delfin Gant, NP   0.5 mg at 10/30/14 0849  . ARIPiprazole (ABILIFY) tablet 10 mg  10 mg Oral Daily Delfin Gant, NP   10 mg at 10/30/14 0850  . loratadine (CLARITIN) tablet 10 mg  10 mg Oral Daily Delfin Gant, NP   10 mg at 10/30/14 0849  . traZODone (DESYREL) tablet 50 mg  50 mg Oral QHS Delfin Gant, NP   50 mg at 10/29/14 2118   Current Outpatient Prescriptions  Medication Sig Dispense Refill  . ALPRAZolam (XANAX) 0.5 MG tablet Take 0.5 mg by mouth 2 (two) times daily.    . ARIPiprazole (ABILIFY) 10 MG tablet Take 10 mg by mouth daily.    . DHA-EPA-VITAMIN E PO Take 1,000 mg by mouth daily.    Marland Kitchen loratadine (CLARITIN) 10 MG tablet Take 10 mg by mouth daily.    . traZODone (DESYREL) 50 MG tablet Take 50 mg by mouth at bedtime.      Musculoskeletal: Strength & Muscle Tone: within normal limits Gait & Station: normal Patient leans: N/A  Psychiatric Specialty Exam: Physical Exam  Review of Systems  Constitutional: Negative.   HENT: Negative.   Eyes: Negative.   Cardiovascular: Negative.   Gastrointestinal: Negative.   Genitourinary: Negative.   Musculoskeletal: Negative.   Skin: Negative.   Neurological: Negative.     Blood pressure 124/59, pulse 60, temperature 97.8 F (36.6 C), temperature source Oral, resp. rate 18, SpO2 100 %.There is no weight on  file to calculate BMI.  General Appearance: Casual and Fairly Groomed  Eye Contact::  Good  Speech:  Clear and Coherent and Normal Rate  Volume:  Normal  Mood:  Anxious and Depressed  Affect:  Congruent and Depressed  Thought Process:  Coherent, Goal Directed and Intact  Orientation:  Full (Time, Place, and Person)  Thought Content:  WDL  Suicidal Thoughts:  No  Homicidal Thoughts:  No  Memory:  Immediate;   Good Recent;   Good Remote;   Fair  Judgement:  Poor  Insight:  Shallow  Psychomotor Activity:  Normal  Concentration:  Good  Recall:  NA  Fund of Knowledge:Fair  Language: Good  Akathisia:  NA  Handed:  Right  AIMS (if indicated):     Assets:  Desire for Improvement  ADL's:  Intact  Cognition: WNL  Sleep:      Medical Decision Making: Review of Psycho-Social Stressors (1), Established Problem, Worsening (2), Review of Medication Regimen & Side Effects (2) and Review of New Medication or Change in Dosage (2)  Treatment Plan Summary: Daily contact with patient to assess and evaluate symptoms and progress in treatment and Medication management  Plan:  Recommend psychiatric Inpatient admission when medically cleared.  Resume home medications, no changes to prescribed medications.  Disposition: Admit to inpatient Psychiatric hospital.  Delfin Gant    PMHNP-BC 10/30/2014 12:22 PM  Patient seen face-to-face for psychiatric evaluation, chart reviewed and case discussed with the physician extender and developed treatment plan. Reviewed the information documented and agree with the treatment plan. Corena Pilgrim, MD

## 2014-10-30 NOTE — [facility] Counselor (Signed)
[facility] Assessment Progress Note  Patient referral information faxed for inpatient consideration to [facility], [facility], [facility], & [facility].  [place]. Ladona Ridgel, MS, NCC Disposition Counselor

## 2014-10-30 NOTE — ED Notes (Signed)
Pt appears blunted and flat this am. He did request a cup of ice. Pt is pleasant and denies SI or HI. He does contract for safety.

## 2014-10-31 NOTE — Consult Note (Signed)
[facility] Face-to-Face Psychiatry Consult   Reason for Consult:  Severe Recurrent Major depression with Psychosis, Agitation, Aggression Referring Physician:  EDP Patient Identification: Jerry Casey. MRN:  [id number] Principal Diagnosis: Severe recurrent major depressive disorder with psychotic features Diagnosis:   Patient Active Problem List   Diagnosis Date Noted  . Severe recurrent major depressive disorder with psychotic features [F33.3] 10/29/2014    Priority: High  . Aggressive behavior [F60.89]   . Palpitations [R00.2] 06/23/2011  . Chest pain [786.5] 06/23/2011    Total Time spent with patient: 1 hour  Subjective:   Jerry Casey. is a 19 y.o. male patient admitted with Severe Recurrent Major depression with Psychosis, Agitation, Aggression.  HPI:  Caucasian male, 20 years old was brought in by his mother and grand mother for anger and agitation.  Patient reports that he hears voices calling him bad names.  Patient reports that he was angry ad stated that he wanted to kill himself.   He reports that his anger increases when people around him make fun of him and calls him names.  Patient, it documented want to kill his father for not wanting a relationship with him.  Patient reports poor sleep and appetite.  He denies SI/HIVH but reports auditory hallucination with the voices calling him bad names.  He also reports Paranoia suspecting people are out to get him.   Patient is accepted for admission and we will be seeking placement at any facility with available inpatient Psychiatric unit.  Patient denies SI/HI [facility] but still is having auditory hallucination with voices calling him "bad names" but did not explain further.  We will continue to seek placement at any inpatient Psychiatric unit with available bed.  Patient denies SI/HI/AVH.  Patient stated that his stressor at home is his uncle Fayrene Fearing who makes fun of of him.  He is interested in going home but want staff to contact his  grandmother and mother regarding his uncle Fayrene Fearing being his stressor.  Grandmother is willing to have him back and will be coming to take him home.  Grandmother reports that Fayrene Fearing is a 21 years old in the house that is too active and likes to irritate patient at home.  Grandmother promises to take care of the situation in the home.  Patient is discharged home.  HPI Elements:   Location:  Major depressive disorder, recurrent with Psychosis. Quality:  severe. Severity:  severe. Timing:  acute. Duration:  sudden. Context:  Brought in by [place] mother and his mother for agitataion.  Past Medical History:  Past Medical History  Diagnosis Date  . Obesity   . Anxiety disorder   . Tic disorder   . Problems with learning   . Premature birth     Past Surgical History  Procedure Laterality Date  . Hernia repair     Family History:  Family History  Problem Relation Age of Onset  . Heart disease Father     WPW  . Heart disease Mother     SVT   Social History:  History  Alcohol Use No     History  Drug Use Not on file    History   Social History  . Marital Status: Single    Spouse Name: N/A  . Number of Children: N/A  . Years of Education: N/A   Occupational History  .      Student   Social History Main Topics  . Smoking status: Never Smoker   . Smokeless tobacco:  Not on file  . Alcohol Use: No  . Drug Use: Not on file  . Sexual Activity: Not on file   Other Topics Concern  . None   Social History Narrative   Additional Social History:    Allergies:   Allergies  Allergen Reactions  . Penicillins Hives  . Sulfa Antibiotics Hives    Labs:  No results found for this or any previous visit (from the past 48 hour(s)).  Vitals: Blood pressure 147/64, pulse 78, temperature 98.6 F (37 C), temperature source Oral, resp. rate 16, SpO2 100 %.  Risk to Self: Is patient at risk for suicide?: Yes Risk to Others:   Prior Inpatient Therapy:   Prior Outpatient Therapy:     Current Facility-Administered Medications  Medication Dose Route Frequency Provider Last Rate Last Dose  . ALPRAZolam Prudy Feeler) tablet 0.5 mg  0.5 mg Oral BID Earney Navy, NP   0.5 mg at 10/30/14 2151  . ARIPiprazole (ABILIFY) tablet 10 mg  10 mg Oral Daily Earney Navy, NP   10 mg at 10/30/14 0850  . loratadine (CLARITIN) tablet 10 mg  10 mg Oral Daily Earney Navy, NP   10 mg at 10/30/14 0849  . traZODone (DESYREL) tablet 50 mg  50 mg Oral QHS Earney Navy, NP   50 mg at 10/30/14 2151   Current Outpatient Prescriptions  Medication Sig Dispense Refill  . ALPRAZolam (XANAX) 0.5 MG tablet Take 0.5 mg by mouth 2 (two) times daily.    . ARIPiprazole (ABILIFY) 10 MG tablet Take 10 mg by mouth daily.    . DHA-EPA-VITAMIN E PO Take 1,000 mg by mouth daily.    Marland Kitchen loratadine (CLARITIN) 10 MG tablet Take 10 mg by mouth daily.    . traZODone (DESYREL) 50 MG tablet Take 50 mg by mouth at bedtime.      Musculoskeletal: Strength & Muscle Tone: within normal limits Gait & Station: normal Patient leans: N/A  Psychiatric Specialty Exam: Physical Exam  Review of Systems  Constitutional: Negative.   HENT: Negative.   Eyes: Negative.   Cardiovascular: Negative.   Gastrointestinal: Negative.   Genitourinary: Negative.   Musculoskeletal: Negative.   Skin: Negative.   Neurological: Negative.     Blood pressure 147/64, pulse 78, temperature 98.6 F (37 C), temperature source Oral, resp. rate 16, SpO2 100 %.There is no weight on file to calculate BMI.  General Appearance: Casual and Fairly Groomed  Patent attorney::  Good  Speech:  Clear and Coherent and Normal Rate  Volume:  Normal  Mood:  Anxious  Affect:  Congruent and Depressed  Thought Process:  Coherent, Goal Directed and Intact  Orientation:  Full (Time, Place, and Person)  Thought Content:  WDL  Suicidal Thoughts:  No  Homicidal Thoughts:  No  Memory:  Immediate;   Good Recent;   Good Remote;   Fair  Judgement:   Poor  Insight:  Shallow  Psychomotor Activity:  Normal  Concentration:  Good  Recall:  NA  Fund of Knowledge:Fair  Language: Good  Akathisia:  NA  Handed:  Right  AIMS (if indicated):     Assets:  Desire for Improvement  ADL's:  Intact  Cognition: WNL  Sleep:      Medical Decision Making: Established Problem, Stable/Improving (1)  Disposition: Discharge home, follow up with your outpatient providers at [facility] services.Earney Navy    PMHNP-BC 10/31/2014 12:01 PM  Patient seen face-to-face for psychiatric evaluation,  chart reviewed and case discussed with the physician extender and developed treatment plan. Reviewed the information documented and agree with the treatment plan. Corena Pilgrim, MD

## 2014-10-31 NOTE — BHH Suicide Risk Assessment (Cosign Needed)
Suicide Risk Assessment  Discharge Assessment   [facility] Discharge Suicide Risk Assessment   Demographic Factors:  Male, Adolescent or young adult, Caucasian, Low socioeconomic status and Unemployed  Total Time spent with patient: 20 minutes  Musculoskeletal: Strength & Muscle Tone: within normal limits Gait & Station: normal Patient leans: N/A  Psychiatric Specialty Exam:     Blood pressure 147/64, pulse 78, temperature 98.6 F (37 C), temperature source Oral, resp. rate 16, SpO2 100 %.There is no weight on file to calculate BMI.  General Appearance: Casual and Fairly Groomed  Patent attorney::  Good  Speech:  Clear and Coherent and Normal Rate  Volume:  Normal  Mood:  Anxious  Affect:  Congruent and Depressed  Thought Process:  Coherent, Goal Directed and Intact  Orientation:  Full (Time, Place, and Person)  Thought Content:  WDL  Suicidal Thoughts:  No  Homicidal Thoughts:  No  Memory:  Immediate;   Good Recent;   Good Remote;   Fair  Judgement:  Poor  Insight:  Shallow  Psychomotor Activity:  Normal  Concentration:  Good  Recall:  NA  Fund of Knowledge:Fair  Language: Good  Akathisia:  NA  Handed:  Right  AIMS (if indicated):     Assets:  Desire for Improvement  ADL's:  Intact  Cognition: WNL        Has this patient used any form of tobacco in the last 30 days? (Cigarettes, Smokeless Tobacco, Cigars, and/or Pipes) N/A  Mental Status Per Nursing Assessment::   On Admission:     Current Mental Status by Physician: NA  Loss Factors: NA  Historical Factors: NA  Risk Reduction Factors:   Living with another person, especially a relative, Positive social support and Positive therapeutic relationship  Continued Clinical Symptoms:  Bipolar Disorder:   Depressive phase Depression:   Insomnia  Cognitive Features That Contribute To Risk:  Closed-mindedness and Polarized thinking    Suicide Risk:  Minimal: No identifiable suicidal ideation.  Patients  presenting with no risk factors but with morbid ruminations; may be classified as minimal risk based on the severity of the depressive symptoms  Principal Problem: Severe recurrent major depressive disorder with psychotic features Discharge Diagnoses:  Patient Active Problem List   Diagnosis Date Noted  . Severe recurrent major depressive disorder with psychotic features [F33.3] 10/29/2014    Priority: High  . Aggressive behavior [F60.89]   . Palpitations [R00.2] 06/23/2011  . Chest pain [786.5] 06/23/2011      Plan Of Care/Follow-up recommendations:  Activity:  As tolerated Diet:  regular  Is patient on multiple antipsychotic therapies at discharge:  No   Has Patient had three or more failed trials of antipsychotic monotherapy by history:  No  Recommended Plan for Multiple Antipsychotic Therapies: NA    Karnisha Lefebre C  PMHNP-BC 10/31/2014, 12:22 PM

## 2014-10-31 NOTE — ED Notes (Addendum)
Pt's grandmother who is his gardian  Is out of town, and mother is to pick him up  and has the guardians permission per TTS

## 2014-10-31 NOTE — BH Assessment (Addendum)
Patient to be discharged home per Dr. Jannifer Franklin and Julieanne Cotton, NP. Patient's guardian, Mellody Memos [phone] was informed of patient's discharge and ok with him returning home. Sts that she is out of town at this time and ok with patient's mother Katy Apo to pick him up. The guardian gave this Clinical research associate verbal consent stating she was ok with patient discharging to mom's care. Writer verified witih charge nurse that this is ok. Patient's mother is already here in the building and ready to take patient. Patient's nurse-Janie made aware. Patient also has a follow up outpatient referral Charleen Kirks with [facility].

## 2014-10-31 NOTE — ED Notes (Signed)
Pt's mom is here to take him home

## 2014-10-31 NOTE — ED Notes (Addendum)
Pt's mom her to pick him up.  Written dc instructions reviewed w/ pt and mom.  Pt encouraged to follow up with his md and take his medications as directed. Pt encouraged to talk to his mom/grandmother if he becomes frustrated or angry.  Pt verbalized understanding.  Mom reports that the pt is scheduled to start twice weekly therapy sessions.  Pt ambulatory w/o difficulty w/ mom to the dc window.  Belongings returned after leaving the area.

## 2015-02-17 NOTE — Progress Notes (Signed)
     HPI: 19 year old male for evaluation of murmur. Patient has been seen in this office previously but not since March 2013. Echocardiogram in February of 2013 suggested an ejection fraction of 60% but technically difficult. CardioNet revealed sinus to sinus tachycardia with rare PVC. Recently diagnosed with Bardet Biedl syndrome. Also noted to have a murmur. Cardiology asked to evaluate. Patient denies dyspnea on exertion, orthopnea, PND, pedal edema or syncope. No palpitations. He has had occasional chest pain in the past but denies exertional chest pain or recent symptoms.  Current Outpatient Prescriptions  Medication Sig Dispense Refill  . ALPRAZolam (XANAX) 0.5 MG tablet Take 1 mg by mouth 2 (two) times daily.     . ARIPiprazole (ABILIFY) 10 MG tablet Take 10 mg by mouth daily.    . DHA-EPA-VITAMIN E PO Take 1,000 mg by mouth daily.    Marland Kitchen loratadine (CLARITIN) 10 MG tablet Take 10 mg by mouth daily.    . traZODone (DESYREL) 50 MG tablet Take 50 mg by mouth at bedtime.     No current facility-administered medications for this visit.    Allergies  Allergen Reactions  . Penicillins Hives  . Sulfa Antibiotics Hives     Past Medical History  Diagnosis Date  . Obesity   . Anxiety disorder   . Tic disorder   . Problems with learning   . Premature birth   . Bardet-Biedl syndrome     Past Surgical History  Procedure Laterality Date  . Hernia repair      Social History   Social History  . Marital Status: Single    Spouse Name: N/A  . Number of Children: N/A  . Years of Education: N/A   Occupational History  . Not on file.   Social History Main Topics  . Smoking status: Never Smoker   . Smokeless tobacco: Not on file  . Alcohol Use: No  . Drug Use: No  . Sexual Activity: Not on file   Other Topics Concern  . Not on file   Social History Narrative    Family History  Problem Relation Age of Onset  . Heart disease Father     WPW  . Heart disease Mother    SVT    ROS: Blindness In right eye but no fevers or chills, productive cough, hemoptysis, dysphasia, odynophagia, melena, hematochezia, dysuria, hematuria, rash, seizure activity, orthopnea, PND, pedal edema, claudication. Remaining systems are negative.  Physical Exam:   Blood pressure 118/62, pulse 53, height  (1.803 m), weight 109.045 kg (240 lb 6.4 oz).  General:  Well developed/well nourished in NAD Skin warm/dry Patient not depressed No peripheral clubbing Back-normal HEENT-normal/normal eyelids Neck supple/normal carotid upstroke bilaterally; no bruits; no JVD; no thyromegaly chest - CTA/ normal expansion CV - RRR/normal S1 and S2; no rubs or gallops;  PMI nondisplaced, 2/6 systolic murmur left sternal border. Abdomen -NT/ND, no HSM, no mass, + bowel sounds, no bruit 2+ femoral pulses, no bruits Ext-no edema, chords, 2+ DP Neuro-grossly nonfocal  ECG Sinus bradycardia at a rate of 53. Lateral T-wave inversion.

## 2015-02-24 ENCOUNTER — Encounter: Payer: Self-pay | Admitting: Cardiology

## 2015-02-24 ENCOUNTER — Ambulatory Visit (INDEPENDENT_AMBULATORY_CARE_PROVIDER_SITE_OTHER): Payer: Medicaid Other | Admitting: Cardiology

## 2015-02-24 ENCOUNTER — Encounter: Payer: Self-pay | Admitting: *Deleted

## 2015-02-24 VITALS — BP 118/62 | HR 53 | Ht 71.0 in | Wt 240.4 lb

## 2015-02-24 DIAGNOSIS — R002 Palpitations: Secondary | ICD-10-CM

## 2015-02-24 DIAGNOSIS — Q8783 Bardet-Biedl syndrome: Secondary | ICD-10-CM | POA: Insufficient documentation

## 2015-02-24 DIAGNOSIS — R011 Cardiac murmur, unspecified: Secondary | ICD-10-CM | POA: Diagnosis not present

## 2015-02-24 DIAGNOSIS — Q8789 Other specified congenital malformation syndromes, not elsewhere classified: Secondary | ICD-10-CM

## 2015-02-24 NOTE — Assessment & Plan Note (Signed)
Patient has been diagnosed with this recently. Manifestations include visual impairment, cognitive disorders, obesity, hypogonadism and renal dysfunction. Cardiac manifestations are less common but also include valvular stenosis, patent ductus arteriosus and cardiomyopathies. Plan echocardiogram to further assess.

## 2015-02-24 NOTE — Assessment & Plan Note (Signed)
Plan echocardiogram to further assess. 

## 2015-02-24 NOTE — Patient Instructions (Signed)
Medication Instructions:   NO CHANGE  Testing/Procedures:  Your physician has requested that you have an echocardiogram. Echocardiography is a painless test that uses sound waves to create images of your heart. It provides your doctor with information about the size and shape of your heart and how well your heart's chambers and valves are working. This procedure takes approximately one hour. There are no restrictions for this procedure.    Follow-Up:  Your physician wants you to follow-up in: ONE YEAR WITH DR CRENSHAW You will receive a reminder letter in the mail two months in advance. If you don't receive a letter, please call our office to schedule the follow-up appointment.      

## 2015-03-04 ENCOUNTER — Other Ambulatory Visit: Payer: Self-pay

## 2015-03-04 ENCOUNTER — Ambulatory Visit (HOSPITAL_COMMUNITY): Payer: Medicaid Other | Attending: Internal Medicine

## 2015-03-04 DIAGNOSIS — R011 Cardiac murmur, unspecified: Secondary | ICD-10-CM | POA: Insufficient documentation

## 2015-03-04 DIAGNOSIS — I517 Cardiomegaly: Secondary | ICD-10-CM | POA: Insufficient documentation

## 2015-03-04 DIAGNOSIS — Z8249 Family history of ischemic heart disease and other diseases of the circulatory system: Secondary | ICD-10-CM | POA: Insufficient documentation

## 2015-03-04 DIAGNOSIS — I071 Rheumatic tricuspid insufficiency: Secondary | ICD-10-CM | POA: Insufficient documentation

## 2016-02-25 ENCOUNTER — Encounter: Payer: Self-pay | Admitting: Cardiology

## 2016-03-02 ENCOUNTER — Telehealth: Payer: Self-pay | Admitting: Cardiology

## 2016-03-02 NOTE — Telephone Encounter (Signed)
Closed Encounter  °

## 2016-03-08 NOTE — Progress Notes (Signed)
      HPI: FU murmur. CardioNet revealed sinus to sinus tachycardia with rare PVC. Previously diagnosed with Bardet Biedl syndrome (manifestations include visual impairment, cognitive disorders, obesity, hypogonadism, renal dysfunction; cardiac manifestations less common but include valvular stenosis, patent ductus arteriosus and cardiomyopathy). Echocardiogram October 2016 showed normal LV systolic and diastolic function. Since last seen, patient complains of intermittent chest pain. It is substernal without radiation. Occurs both with exertion and at rest. Last one hour at a time. Occasional increase after eating. No associated symptoms.  Current Outpatient Prescriptions  Medication Sig Dispense Refill  . ALPRAZolam (XANAX) 0.5 MG tablet Take 1 mg by mouth 2 (two) times daily.     Marland Kitchen. ALPRAZolam (XANAX) 1 MG tablet Take 1 mg by mouth as needed.    . ARIPiprazole (ABILIFY) 20 MG tablet Take 20 mg by mouth daily.    . DOCOSAHEXAENOIC ACID PO Take 1 tablet by mouth daily.    Marland Kitchen. loratadine (CLARITIN) 10 MG tablet Take 10 mg by mouth daily.    . traZODone (DESYREL) 50 MG tablet Take 50 mg by mouth at bedtime.     No current facility-administered medications for this visit.      Past Medical History:  Diagnosis Date  . Anxiety disorder   . Bardet-Biedl syndrome   . Obesity   . Premature birth   . Problems with learning   . Tic disorder     Past Surgical History:  Procedure Laterality Date  . HERNIA REPAIR      Social History   Social History  . Marital status: Single    Spouse name: N/A  . Number of children: N/A  . Years of education: N/A   Occupational History  . Not on file.   Social History Main Topics  . Smoking status: Never Smoker  . Smokeless tobacco: Never Used  . Alcohol use No  . Drug use: No  . Sexual activity: Not on file   Other Topics Concern  . Not on file   Social History Narrative  . No narrative on file    Family History  Problem Relation Age of  Onset  . Heart disease Father     WPW  . Heart disease Mother     SVT    ROS: no fevers or chills, productive cough, hemoptysis, dysphasia, odynophagia, melena, hematochezia, dysuria, hematuria, rash, seizure activity, orthopnea, PND, pedal edema, claudication. Remaining systems are negative.  Physical Exam: Well-developed well-nourished in no acute distress.  Skin is warm and dry.  HEENT is normal.  Neck is supple.  Chest is clear to auscultation with normal expansion.  Cardiovascular exam is regular rate and rhythm.  Abdominal exam nontender or distended. No masses palpated. Extremities show no edema. neuro grossly intact  ECG-sinus bradycardia, left ventricular hypertrophy.  A/P  1 chest pain symptoms are atypical. I will arrange an exercise treadmill to further evaluate.  2 Bardet-Biedel syndrome-no evidence of cardiomyopathy, PDA or valvular stenosis on previous echo.  3 history of murmur-echo as outlined above.   Olga MillersBrian Crenshaw, MD

## 2016-03-09 ENCOUNTER — Encounter: Payer: Self-pay | Admitting: Cardiology

## 2016-03-09 ENCOUNTER — Ambulatory Visit: Payer: Medicaid Other | Admitting: Cardiology

## 2016-03-09 ENCOUNTER — Ambulatory Visit (INDEPENDENT_AMBULATORY_CARE_PROVIDER_SITE_OTHER): Payer: Medicaid Other | Admitting: Cardiology

## 2016-03-09 VITALS — BP 122/60 | HR 53 | Ht 71.0 in | Wt 229.0 lb

## 2016-03-09 DIAGNOSIS — R072 Precordial pain: Secondary | ICD-10-CM | POA: Diagnosis not present

## 2016-03-09 DIAGNOSIS — R0789 Other chest pain: Secondary | ICD-10-CM | POA: Diagnosis not present

## 2016-03-09 DIAGNOSIS — Q8789 Other specified congenital malformation syndromes, not elsewhere classified: Secondary | ICD-10-CM

## 2016-03-09 DIAGNOSIS — R011 Cardiac murmur, unspecified: Secondary | ICD-10-CM

## 2016-03-09 NOTE — Patient Instructions (Signed)
Medication Instructions:  Continue current medications  Labwork: None Ordered  Testing/Procedures: Your physician has requested that you have an exercise tolerance test. For further information please visit [email or web address]. Please also follow instruction sheet, as given.   Follow-Up: Your physician wants you to follow-up in: 1 Year. You will receive a reminder letter in the mail two months in advance. If you don't receive a letter, please call our office to schedule the follow-up appointment.   Any Other Special Instructions Will Be Listed Below (If Applicable).   If you need a refill on your cardiac medications before your next appointment, please call your pharmacy.   

## 2016-03-11 ENCOUNTER — Telehealth (HOSPITAL_COMMUNITY): Payer: Self-pay

## 2016-03-11 NOTE — Telephone Encounter (Signed)
Encounter complete. 

## 2016-03-16 ENCOUNTER — Ambulatory Visit (HOSPITAL_COMMUNITY)
Admission: RE | Admit: 2016-03-16 | Discharge: 2016-03-16 | Disposition: A | Discharge status: Home / Self Care | Payer: Medicaid Other | Source: Ambulatory Visit | Attending: Cardiovascular Disease | Admitting: Cardiovascular Disease

## 2016-03-16 ENCOUNTER — Encounter (HOSPITAL_COMMUNITY): Payer: Self-pay | Admitting: *Deleted

## 2016-03-16 DIAGNOSIS — R5383 Other fatigue: Secondary | ICD-10-CM | POA: Insufficient documentation

## 2016-03-16 DIAGNOSIS — I517 Cardiomegaly: Secondary | ICD-10-CM | POA: Insufficient documentation

## 2016-03-16 DIAGNOSIS — R9439 Abnormal result of other cardiovascular function study: Secondary | ICD-10-CM | POA: Diagnosis not present

## 2016-03-16 DIAGNOSIS — R0789 Other chest pain: Secondary | ICD-10-CM | POA: Diagnosis not present

## 2016-03-16 DIAGNOSIS — R0602 Shortness of breath: Secondary | ICD-10-CM | POA: Insufficient documentation

## 2016-03-16 LAB — EXERCISE TOLERANCE TEST
CHL CUP STRESS STAGE 1 DBP: 43 mmHg
CHL CUP STRESS STAGE 1 GRADE: 0 %
CHL CUP STRESS STAGE 1 HR: 77 {beats}/min
CHL CUP STRESS STAGE 1 SBP: 138 mmHg
CHL CUP STRESS STAGE 1 SPEED: 0 mph
CHL CUP STRESS STAGE 2 GRADE: 0 %
CHL CUP STRESS STAGE 2 SPEED: 1 mph
CHL CUP STRESS STAGE 3 GRADE: 0 %
CHL CUP STRESS STAGE 3 HR: 75 {beats}/min
CHL CUP STRESS STAGE 4 HR: 115 {beats}/min
CHL CUP STRESS STAGE 5 DBP: 58 mmHg
CHL CUP STRESS STAGE 5 SPEED: 2.5 mph
CHL CUP STRESS STAGE 6 GRADE: 14 %
CHL CUP STRESS STAGE 7 SPEED: 0 mph
CHL CUP STRESS STAGE 8 GRADE: 0 %
CHL CUP STRESS STAGE 8 HR: 100 {beats}/min
CHL CUP STRESS STAGE 8 SBP: 120 mmHg
CHL CUP STRESS STAGE 8 SPEED: 0 mph
CHL RATE OF PERCEIVED EXERTION: 17
CSEPED: 8 min
CSEPEDS: 1 s
CSEPEW: 10.1 METS
CSEPHR: 86 %
MPHR: 198 {beats}/min
Peak BP: 212 mmHg
Peak HR: 166 {beats}/min
Percent of predicted max HR: 83 %
Rest HR: 68 {beats}/min
Stage 2 HR: 75 {beats}/min
Stage 3 Speed: 1 mph
Stage 4 DBP: 51 mmHg
Stage 4 Grade: 10 %
Stage 4 SBP: 121 mmHg
Stage 4 Speed: 1.7 mph
Stage 5 Grade: 12 %
Stage 5 HR: 141 {beats}/min
Stage 5 SBP: 126 mmHg
Stage 6 DBP: 93 mmHg
Stage 6 HR: 166 {beats}/min
Stage 6 SBP: 212 mmHg
Stage 6 Speed: 3.4 mph
Stage 7 DBP: 23 mmHg
Stage 7 Grade: 0 %
Stage 7 HR: 137 {beats}/min
Stage 7 SBP: 143 mmHg
Stage 8 DBP: 56 mmHg

## 2016-03-16 NOTE — Progress Notes (Unsigned)
Abnormal ETT was reviewed by Dr. Royann Shiversroitoru and patient was given the ok to be discharged to go home.

## 2016-03-17 ENCOUNTER — Telehealth: Payer: Self-pay | Admitting: *Deleted

## 2016-03-17 DIAGNOSIS — R9439 Abnormal result of other cardiovascular function study: Secondary | ICD-10-CM

## 2016-03-17 NOTE — Telephone Encounter (Signed)
-----   Message from Lewayne BuntingBrian S Crenshaw, MD sent at 03/16/2016  1:17 PM EDT ----- Stress echo to further assess Olga MillersBrian Crenshaw

## 2016-03-17 NOTE — Telephone Encounter (Signed)
Spoke with pt guardian, aware of results. Order placed for stress echo and sent to admin for scheduling.

## 2016-04-01 ENCOUNTER — Telehealth (HOSPITAL_COMMUNITY): Payer: Self-pay | Admitting: *Deleted

## 2016-04-01 NOTE — Telephone Encounter (Signed)
Patient's grandmother (per DPR) given detailed instructions per Stress Test Requisition Sheet for test on 04/07/16 at 2:30.Patient Notified to arrive 30 minutes early, and that it is imperative to arrive on time for appointment to keep from having the test rescheduled.  Patient verbalized understanding. Daneil DolinSharon S Brooks

## 2016-04-07 ENCOUNTER — Ambulatory Visit (HOSPITAL_COMMUNITY): Payer: Medicaid Other | Attending: Cardiology

## 2016-04-07 ENCOUNTER — Ambulatory Visit (HOSPITAL_BASED_OUTPATIENT_CLINIC_OR_DEPARTMENT_OTHER): Payer: Medicaid Other

## 2016-04-07 DIAGNOSIS — Z6841 Body Mass Index (BMI) 40.0 and over, adult: Secondary | ICD-10-CM | POA: Insufficient documentation

## 2016-04-07 DIAGNOSIS — R9439 Abnormal result of other cardiovascular function study: Secondary | ICD-10-CM

## 2016-04-07 DIAGNOSIS — E669 Obesity, unspecified: Secondary | ICD-10-CM | POA: Insufficient documentation

## 2016-04-07 DIAGNOSIS — Z8249 Family history of ischemic heart disease and other diseases of the circulatory system: Secondary | ICD-10-CM | POA: Diagnosis not present

## 2016-04-07 DIAGNOSIS — R0989 Other specified symptoms and signs involving the circulatory and respiratory systems: Secondary | ICD-10-CM

## 2016-05-05 IMAGING — CR DG FOOT COMPLETE 3+V*L*
3 series · 3 of 3 positions shown · non-contrast
Comparison: None.

CLINICAL DATA: Stepped on nail yesterday. Wound is plantar at the
base of the great toe.

EXAM:
LEFT FOOT - COMPLETE 3+ VIEW

[foot ap]
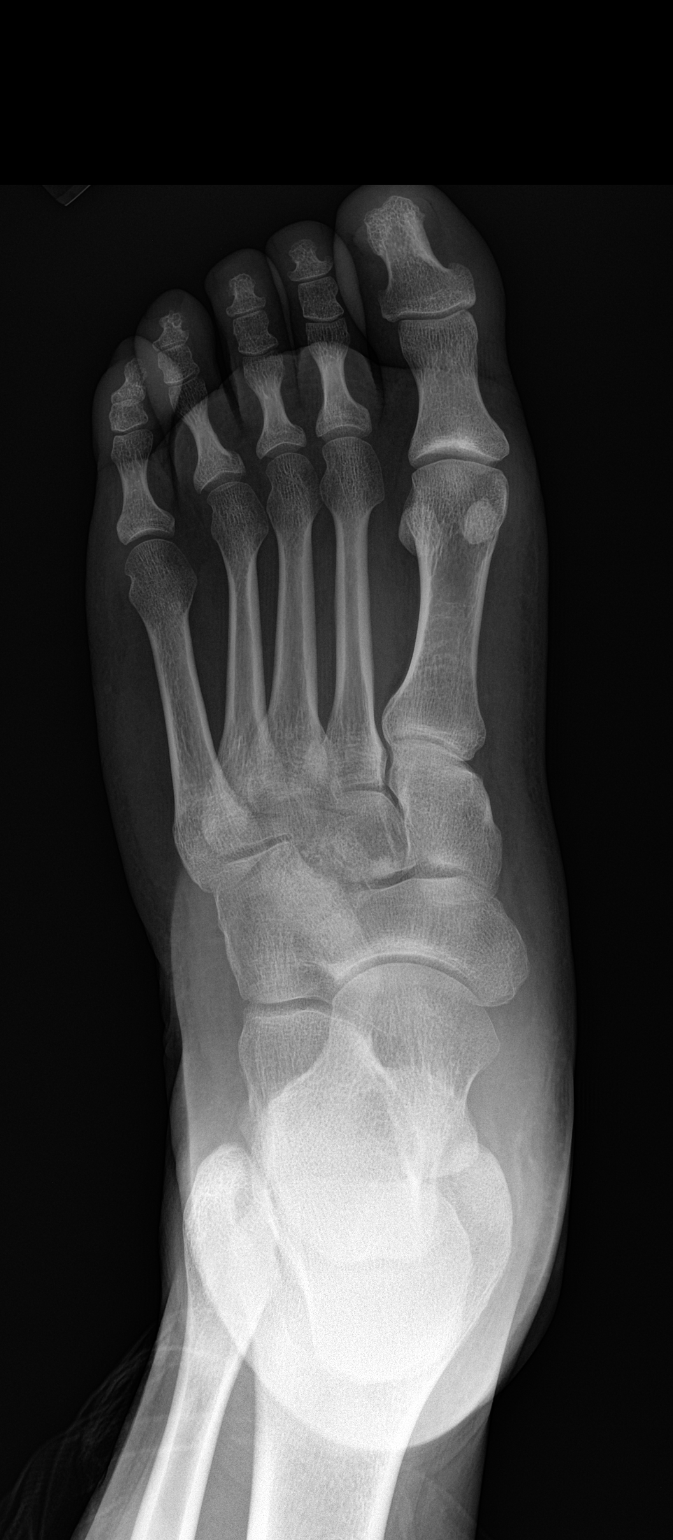

[foot obl]
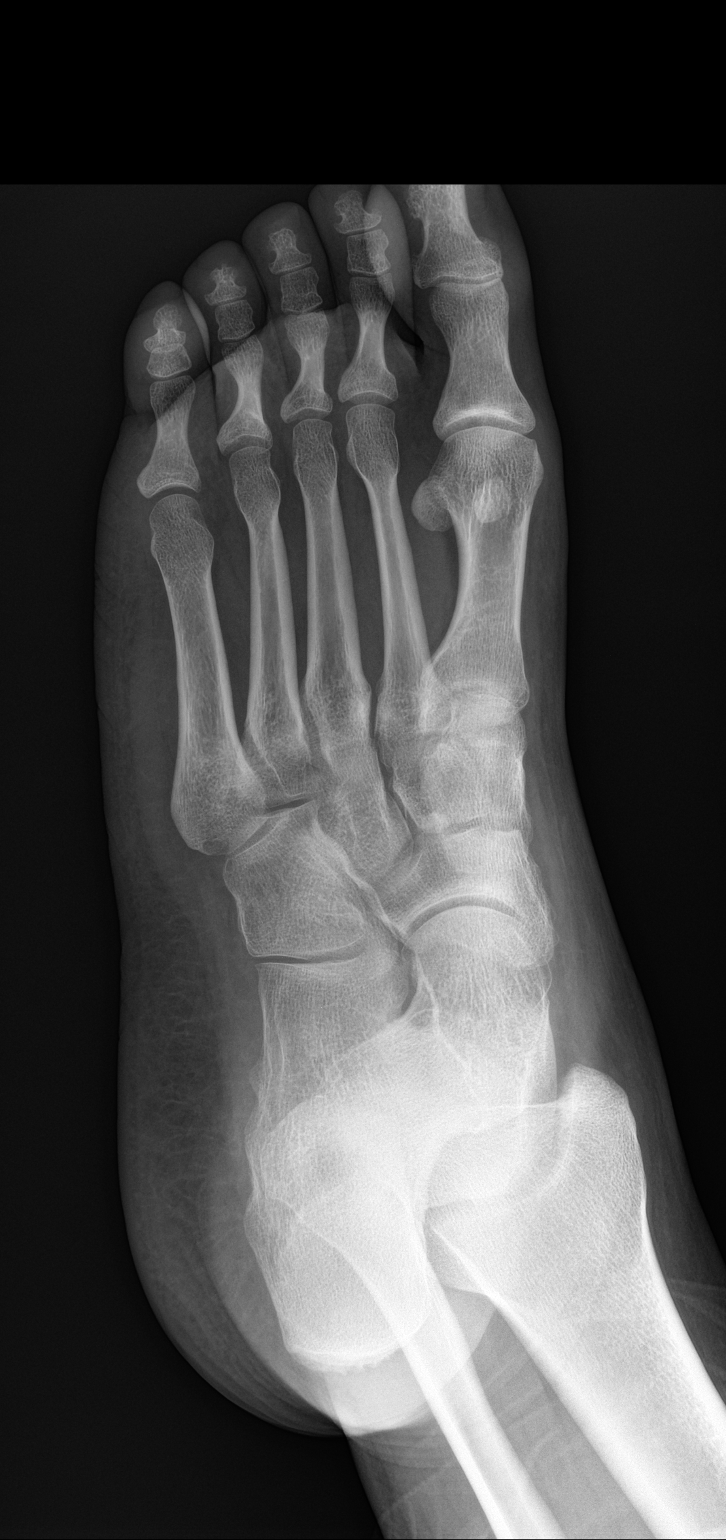

[foot lat]
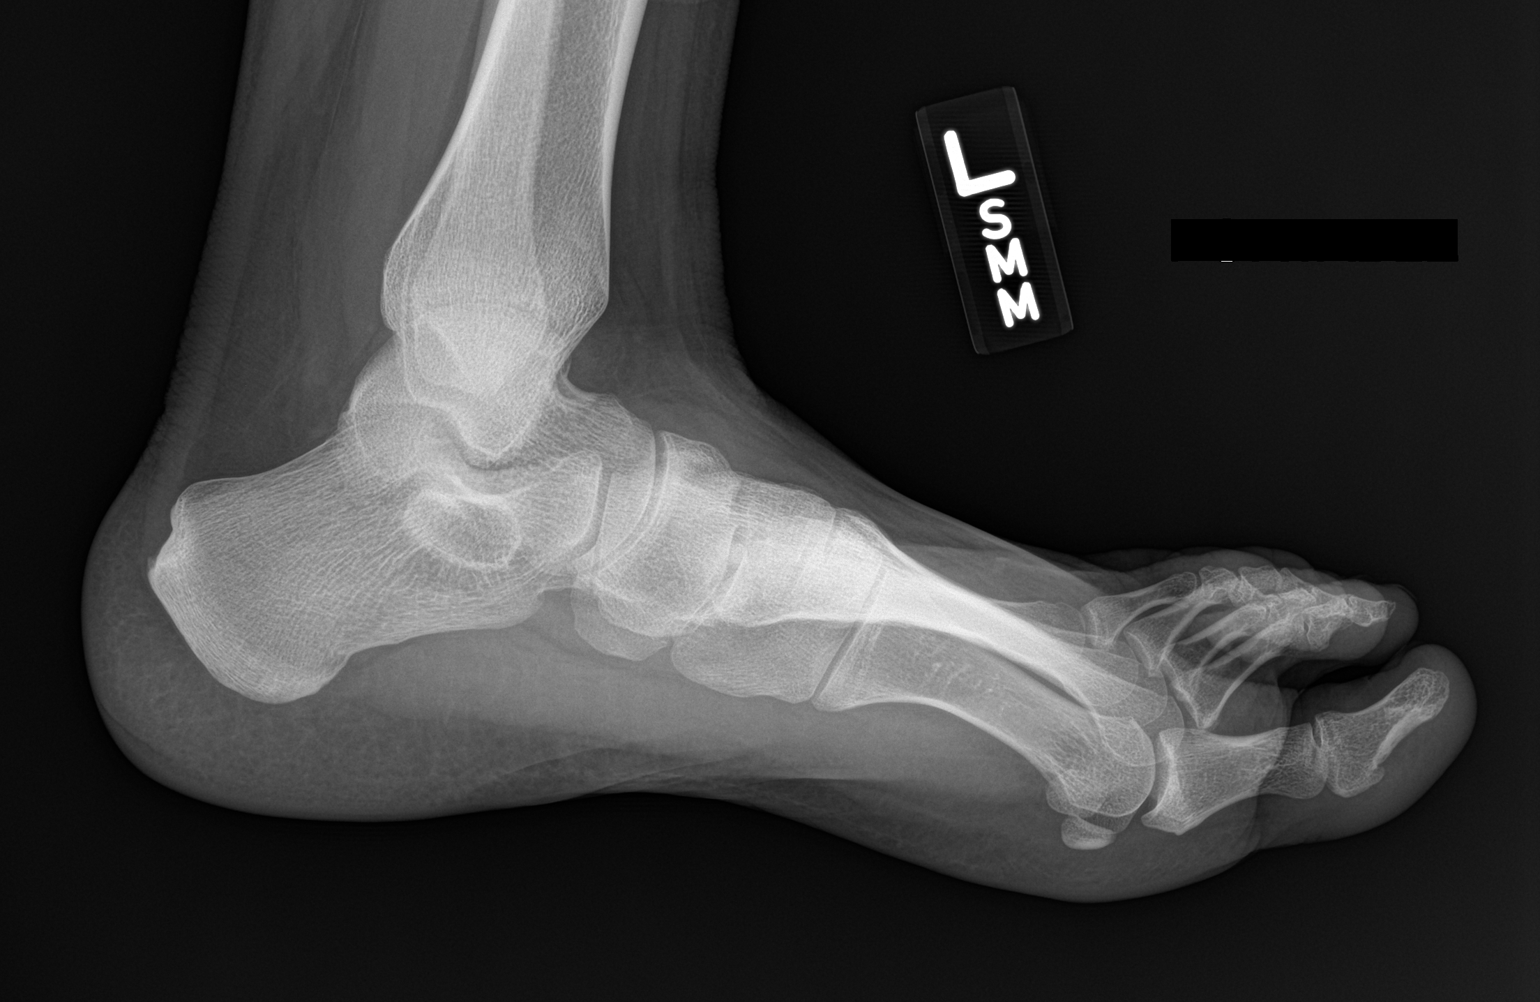

[3 of 3 positions shown; findings below may reference images not displayed]

FINDINGS: There is no evidence of fracture or dislocation. There is no
evidence of arthropathy or other focal bone abnormality. Soft
tissues are unremarkable.
IMPRESSION: Negative.

## 2017-07-06 ENCOUNTER — Emergency Department (HOSPITAL_COMMUNITY)
Admission: EM | Admit: 2017-07-06 | Discharge: 2017-07-06 | Disposition: A | Discharge status: Home / Self Care | Payer: Medicaid Other | Attending: Emergency Medicine | Admitting: Emergency Medicine

## 2017-07-06 ENCOUNTER — Encounter (HOSPITAL_COMMUNITY): Payer: Self-pay | Admitting: Emergency Medicine

## 2017-07-06 ENCOUNTER — Emergency Department (HOSPITAL_COMMUNITY): Payer: Medicaid Other

## 2017-07-06 DIAGNOSIS — Z79899 Other long term (current) drug therapy: Secondary | ICD-10-CM | POA: Insufficient documentation

## 2017-07-06 DIAGNOSIS — R509 Fever, unspecified: Secondary | ICD-10-CM | POA: Insufficient documentation

## 2017-07-06 DIAGNOSIS — R079 Chest pain, unspecified: Secondary | ICD-10-CM

## 2017-07-06 LAB — INFLUENZA PANEL BY PCR (TYPE A & B)
Influenza A By PCR: NEGATIVE
Influenza B By PCR: NEGATIVE

## 2017-07-06 LAB — I-STAT TROPONIN, ED: Troponin i, poc: 0 ng/mL (ref 0.00–0.08)

## 2017-07-06 MED ORDER — ACETAMINOPHEN 325 MG PO TABS: 650.0000 mg | ORAL_TABLET | Freq: Once | ORAL | Status: DC

## 2017-07-06 MED ORDER — ONDANSETRON 4 MG PO TBDP: 4.0000 mg | ORAL_TABLET | Freq: Three times a day (TID) | ORAL | 0 refills | Status: AC | PRN

## 2017-07-06 MED ORDER — OSELTAMIVIR PHOSPHATE 75 MG PO CAPS: 75.0000 mg | ORAL_CAPSULE | Freq: Two times a day (BID) | ORAL | 0 refills | Status: AC

## 2017-07-06 MED ORDER — ONDANSETRON 4 MG PO TBDP
8.0000 mg | ORAL_TABLET | Freq: Once | ORAL | Status: DC
  Filled 2017-07-06: qty 2

## 2017-07-06 NOTE — ED Triage Notes (Signed)
Per eMS:  Patient had sudden onset of chest heaviness with 2 episodes of vomiting while out shopping with his mom.  Patient has a hx of MR and is a poor historian about his symptoms.  Symptoms lasted 1 hour with anxiety.  324 ASA given with fire.    Dr. Denton LankSteinl at bedside

## 2017-07-06 NOTE — ED Provider Notes (Signed)
[facility] EMERGENCY DEPARTMENT Provider Note   CSN: [id number] Arrival date & time: 07/06/17  1633     History   Chief Complaint Chief Complaint  Patient presents with  . Chest Pain    HPI Jerry Casey. is a 22 y.o. male.  HPI   22 year old male presents today with complaints of chest pain.  Patient has a history of MR, and has a difficulty time describing his symptoms.  Patient's grandmother is at bedside who helps aid in discussion.  Patient notes that several hours prior to arrival patient was out shopping with his mom.  He notes chest pressure with 2 episodes of vomiting.  Patient notes symptoms lasted approximately 1 hour and have completely resolved.  He denies any associated abdominal pain, denies any present chest pain or shortness of breath.  Patient denies any fever but does note around the same time he had body aches and shaking.  Patient denies.  History of cardiac disease blood clots any significant risk factors for the same other than obesity.   Past Medical History:  Diagnosis Date  . Anxiety disorder   . Bardet-Biedl syndrome   . Obesity   . Premature birth   . Problems with learning   . Tic disorder     Patient Active Problem List   Diagnosis Date Noted  . 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 Murmur 02/24/2015  . Aggressive behavior   . 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 Palpitations 06/23/2011  . Chest pain 06/23/2011    Past Surgical History:  Procedure Laterality Date  . HERNIA REPAIR         Home Medications    Prior to Admission medications   Medication Sig Start Date End Date Taking? Authorizing Provider  ALPRAZolam Prudy Feeler) 1 MG tablet Take 1 mg by mouth as needed. 01/20/16   [provider]  ARIPiprazole (ABILIFY) 20 MG tablet Take 20 mg by mouth daily. 01/20/16   [provider]  DOCOSAHEXAENOIC ACID PO Take 1 tablet by mouth daily.    [provider]  loratadine (CLARITIN) 10 MG tablet Take 10 mg by mouth daily.    [provider]  ondansetron (ZOFRAN ODT) 4 MG disintegrating tablet Take 1 tablet (4 mg total) by mouth every 8 (eight) hours as needed for nausea or vomiting. 07/06/17   Harlean Regula, [name], PA-C  oseltamivir (TAMIFLU) 75 MG capsule Take 1 capsule (75 mg total) by mouth every 12 (twelve) hours. 07/06/17   Zamari Bonsall, [name], PA-C  traZODone (DESYREL) 50 MG tablet Take 50 mg by mouth at bedtime.    [provider]    Family History Family History  Problem Relation Age of Onset  . Heart disease Father        WPW  . Heart disease Mother        SVT    Social History Social History   Tobacco Use  . Smoking status: Never Smoker  . Smokeless tobacco: Never Used  Substance Use Topics  . Alcohol use: No    Alcohol/week: 0.0 oz  . Drug use: No     Allergies   Penicillins and Sulfa antibiotics   Review of Systems Review of Systems  All other systems reviewed and are negative.    Physical Exam Updated Vital Signs BP (!) 154/71 (BP Location: Right Arm)   Pulse 85   Temp 98.3 F (36.8 C) (Oral)   Resp 18   SpO2 98%  Physical Exam  Constitutional: He is oriented to person, place, and time. He appears well-developed and well-nourished.  HENT:  Head: Normocephalic and atraumatic.  Eyes: Conjunctivae are normal. Pupils are equal, round, and reactive to light. Right eye exhibits no discharge. Left eye exhibits no discharge. No scleral icterus.  Neck: Normal range of motion. No JVD present. No tracheal deviation present.  Cardiovascular: Normal rate, regular rhythm and normal heart sounds. Exam reveals no gallop and no friction rub.  No murmur heard. Pulmonary/Chest: Effort normal and breath sounds normal. No stridor. No respiratory distress. He has no wheezes. He has no rales. He exhibits no tenderness.  Abdominal: Soft. He exhibits no distension and no mass. There is no tenderness.  There is no rebound and no guarding. No hernia.  Neurological: He is alert and oriented to person, place, and time. Coordination normal.  Psychiatric: He has a normal mood and affect. His behavior is normal. Judgment and thought content normal.  Nursing note and vitals reviewed.    ED Treatments / Results  Labs (all labs ordered are listed, but only abnormal results are displayed) Labs Reviewed  INFLUENZA PANEL BY PCR (TYPE A & B)  I-STAT TROPONIN, ED    EKG  EKG Interpretation  Date/Time:  Wednesday July 06 2017 16:39:49 EST Ventricular Rate:  108 PR Interval:  140 QRS Duration: 90 QT Interval:  360 QTC Calculation: 482 R Axis:   44 Text Interpretation:  Sinus tachycardia Nonspecific T wave abnormality Confirmed by Cathren LaineSteinl, Kevin ([id number]) on 07/06/2017 4:49:04 PM       Radiology Dg Chest 2 View  Result Date: 07/06/2017 CLINICAL DATA:  Acute onset chest pain and vomiting today. EXAM: CHEST  2 VIEW COMPARISON:  None. FINDINGS: The heart size and mediastinal contours are within normal limits. Both lungs are clear. The visualized skeletal structures are unremarkable. IMPRESSION: Negative.  No active cardiopulmonary disease. Electronically Signed   By: Myles RosenthalJohn  Stahl M.D.   On: 07/06/2017 17:11    Procedures Procedures (including critical care time)  Medications Ordered in ED Medications  ondansetron (ZOFRAN-ODT) disintegrating tablet 8 mg (8 mg Oral Refused 07/06/17 1802)  acetaminophen (TYLENOL) tablet 650 mg (650 mg Oral Refused 07/06/17 1916)     Initial Impression / Assessment and Plan / ED Course  I have reviewed the triage vital signs and the nursing notes.  Pertinent labs & imaging results that were available during my care of the patient were reviewed by me and considered in my medical decision making (see chart for details).      Final Clinical Impressions(s) / ED Diagnoses   Final diagnoses:  Chest pain, unspecified type  Fever, unspecified fever cause    Labs: I-STAT troponin  Imaging: DG chest 2 view, EKG  Consults:  Therapeutics: Tylenol  Discharge Meds: Tamiflu  Assessment/Plan: 22 year old male presents today with chest pain nausea and vomiting.  Patient also had shaking and chills, he was initially febrile upon arrival.  Patient is asymptomatic at this time with no chest pain shortness of breath abdominal pain.  He is well-appearing in no acute distress.  I question if patient has influenza given his acute onset of symptoms with fever here, also question gastroenteritis.  I have low suspicion for ACS, reassuring EKG troponin and chest x-ray.  Influenza sent, Tamiflu sent after discussion of risks and benefits with family.  Patient has a follow-up appointment with his primary care provider tomorrow.  He will follow-up as indicated.  No signs of DVT or PE.  No other return precautions given.  Both patient and family verbalized understanding and agreement to today's plan.    ED Discharge Orders        Ordered    oseltamivir (TAMIFLU) 75 MG capsule  Every 12 hours     07/06/17 1931    ondansetron (ZOFRAN ODT) 4 MG disintegrating tablet  Every 8 hours PRN     07/06/17 1932       Eyvonne Mechanic, PA-C 07/06/17 1932    Cathren Laine, MD 07/06/17 [phone]

## 2017-07-06 NOTE — ED Notes (Signed)
Patient verbalized understanding of discharge instructions and denies any further needs or questions at this time. VS stable. Patient ambulatory with steady gait.  

## 2017-07-06 NOTE — Discharge Instructions (Signed)
Please read attached information. If you experience any new or worsening signs or symptoms please return to the emergency room for evaluation. Please follow-up with your primary care provider or specialist as discussed. Please use medication prescribed only as directed and discontinue taking if you have any concerning signs or symptoms.   °

## 2018-05-29 ENCOUNTER — Emergency Department
Admission: EM | Admit: 2018-05-29 | Discharge: 2018-05-29 | Disposition: A | Discharge status: Home / Self Care | Payer: Medicaid Other | Source: Home / Self Care | Attending: Family Medicine | Admitting: Family Medicine

## 2018-05-29 ENCOUNTER — Other Ambulatory Visit: Payer: Self-pay

## 2018-05-29 DIAGNOSIS — B9789 Other viral agents as the cause of diseases classified elsewhere: Secondary | ICD-10-CM

## 2018-05-29 DIAGNOSIS — J069 Acute upper respiratory infection, unspecified: Secondary | ICD-10-CM | POA: Diagnosis not present

## 2018-05-29 MED ORDER — DOXYCYCLINE HYCLATE 100 MG PO CAPS: 100.0000 mg | ORAL_CAPSULE | Freq: Two times a day (BID) | ORAL | 0 refills | Status: AC

## 2018-05-29 MED ORDER — BENZONATATE 200 MG PO CAPS: ORAL_CAPSULE | ORAL | 0 refills | Status: AC

## 2018-05-29 NOTE — Discharge Instructions (Addendum)
Take plain guaifenesin (1200mg  extended release tabs such as Mucinex) twice daily, with plenty of water, for cough and congestion.  Get adequate rest.   May use Afrin nasal spray (or generic oxymetazoline) each morning for about 5 days and then discontinue.  Also recommend using saline nasal spray several times daily and saline nasal irrigation (AYR is a common brand).    May take Delsym Cough Suppressant with Tessalon at bedtime for nighttime cough.  Try warm salt water gargles for sore throat.  Stop all antihistamines for now, and other non-prescription cough/cold preparations. Begin Doxycycline if not improving about one week or if persistent fever develops

## 2018-05-29 NOTE — ED Triage Notes (Signed)
Pt presents with Mellody Memos (his guardian) for cough and chest congestion for a couple days. He has taken a decongestant and cough syrup otc without any improvement. Pt denies pain.

## 2018-05-29 NOTE — ED Provider Notes (Signed)
[facility]    CSN: [id number] Arrival date & time: 05/29/18  1326     History   Chief Complaint Chief Complaint  Patient presents with  . Cough  . Nasal Congestion    HPI Jerry Casey. is a 23 y.o. male.   Patient complains of five day history of typical cold-like symptoms developing over several days, including mild sore throat, sinus congestion, fatigue, and cough.  He denies fever and has had no pleuritic pain or shortness of breath   The history is provided by the patient and a caregiver.    Past Medical History:  Diagnosis Date  . Anxiety disorder   . Bardet-Biedl syndrome   . Obesity   . Premature birth   . Problems with learning   . Tic disorder     Patient Active Problem List   Diagnosis Date Noted  . 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 Murmur 02/24/2015  . Aggressive behavior   . 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 Palpitations 06/23/2011  . Chest pain 06/23/2011    Past Surgical History:  Procedure Laterality Date  . HERNIA REPAIR         Home Medications    Prior to Admission medications   Medication Sig Start Date End Date Taking? Authorizing Provider  ARIPiprazole (ABILIFY) 10 MG tablet Take 10 mg by mouth daily.    [provider]  benzonatate (TESSALON) 200 MG capsule Take one cap by mouth at bedtime as needed for cough.  May repeat in 4 to 6 hours 05/29/18   [name], MD  clonazePAM (KLONOPIN) 1 MG tablet Take 1 mg by mouth daily as needed (for irritability or anxiety).    [provider]  doxycycline (VIBRAMYCIN) 100 MG capsule Take 1 capsule (100 mg total) by mouth 2 (two) times daily. Take with food (Rx void after 06/06/18) 05/29/18   [name], MD  fenofibrate (TRICOR) 145 MG tablet Take 145 mg by mouth at bedtime.    [provider]  FLUoxetine (PROZAC) 20 MG capsule Take 20 mg by mouth every morning.    [provider]    ibuprofen (ADVIL,MOTRIN) 200 MG tablet Take 200 mg by mouth every 6 (six) hours as needed (for headaches).    [provider]  loratadine (CLARITIN) 10 MG tablet Take 10 mg by mouth daily as needed for allergies or rhinitis.     [provider]  Lutein-Zeaxanthin 20-0.8 MG CAPS Take 2 capsules by mouth daily.    [provider]  Omega-3 Fatty Acids (FISH OIL) 1000 MG CAPS Take 1,000 mg by mouth daily.    [provider]  ondansetron (ZOFRAN ODT) 4 MG disintegrating tablet Take 1 tablet (4 mg total) by mouth every 8 (eight) hours as needed for nausea or vomiting. 07/06/17   [name], [name], PA-C  oseltamivir (TAMIFLU) 75 MG capsule Take 1 capsule (75 mg total) by mouth every 12 (twelve) hours. 07/06/17   [name], [name], PA-C  traZODone (DESYREL) 50 MG tablet Take 50 mg by mouth at bedtime.    [provider]  VITAMIN A PO Take 7,200 mcg by mouth daily.    [provider]    Family History Family History  Problem Relation Age of Onset  . Heart disease Father        WPW  . Heart disease Mother        SVT    Social History Social History  Tobacco Use  . Smoking status: Never Smoker  . Smokeless tobacco: Never Used  Substance Use Topics  . Alcohol use: No    Alcohol/week: 0.0 standard drinks  . Drug use: No     Allergies   Penicillins and Sulfa antibiotics   Review of Systems Review of Systems + sore throat + cough No pleuritic pain No wheezing + nasal congestion + post-nasal drainage No sinus pain/pressure No itchy/red eyes No earache No hemoptysis No SOB No fever/chills No nausea No vomiting No abdominal pain No diarrhea No urinary symptoms No skin rash + fatigue No myalgias No headache Used OTC meds without relief   Physical Exam Triage Vital Signs ED Triage Vitals  Enc Vitals Group     BP 05/29/18 1412 (!) 142/77     Pulse Rate 05/29/18 1408 78     Resp 05/29/18 1412 18     Temp 05/29/18 1408  98.5 F (36.9 C)     Temp Source 05/29/18 1408 Oral     SpO2 05/29/18 1412 97 %     Weight 05/29/18 1413 248 lb (112.5 kg)     Height 05/29/18 1413 5\' 11"  (1.803 m)     Head Circumference --      Peak Flow --      Pain Score 05/29/18 1412 0     Pain Loc --      Pain Edu? --      Excl. in GC? --    No data found.  Updated Vital Signs BP (!) 142/77 (BP Location: Right Arm)   Pulse 78   Temp 98.5 F (36.9 C) (Oral)   Resp 18   Ht 5\' 11"  (1.803 m)   Wt 112.5 kg   SpO2 97%   BMI 34.59 kg/m   Visual Acuity Right Eye Distance:   Left Eye Distance:   Bilateral Distance:    Right Eye Near:   Left Eye Near:    Bilateral Near:     Physical Exam Nursing notes and Vital Signs reviewed. Appearance:  Patient appears stated age, and in no acute distress Eyes:  Pupils are equal, round, and reactive to light and accomodation.  Extraocular movement is intact.  Conjunctivae are not inflamed  Ears:  Canals normal.  Tympanic membranes normal.  Nose:  Mildly congested turbinates.  No sinus tenderness.    Pharynx:  Normal Neck:  Supple.  Enlarged posterior/lateral nodes are palpated bilaterally, tender to palpation on the left.   Lungs:  Clear to auscultation.  Breath sounds are equal.  Moving air well. Heart:  Regular rate and rhythm without murmurs, rubs, or gallops.  Abdomen:  Nontender without masses or hepatosplenomegaly.  Bowel sounds are present.  No CVA or flank tenderness.  Extremities:  No edema.  Skin:  No rash present.    UC Treatments / Results  Labs (all labs ordered are listed, but only abnormal results are displayed) Labs Reviewed - No data to display  EKG None  Radiology No results found.  Procedures Procedures (including critical care time)  Medications Ordered in UC Medications - No data to display  Initial Impression / Assessment and Plan / UC Course  I have reviewed the triage vital signs and the nursing notes.  Pertinent labs & imaging results that  were available during my care of the patient were reviewed by me and considered in my medical decision making (see chart for details).    There is no evidence of bacterial infection today.  Treat symptomatically  for now  Prescription written for Benzonatate (Tessalon) to take at bedtime for night-time cough.  Followup with Family Doctor if not improved in about 10 days.   Final Clinical Impressions(s) / UC Diagnoses   Final diagnoses:  Viral URI with cough     Discharge Instructions     Take plain guaifenesin (1200mg  extended release tabs such as Mucinex) twice daily, with plenty of water, for cough and congestion.  Get adequate rest.   May use Afrin nasal spray (or generic oxymetazoline) each morning for about 5 days and then discontinue.  Also recommend using saline nasal spray several times daily and saline nasal irrigation (AYR is a common brand).    May take Delsym Cough Suppressant with Tessalon at bedtime for nighttime cough.  Try warm salt water gargles for sore throat.  Stop all antihistamines for now, and other non-prescription cough/cold preparations. Begin Doxycycline if not improving about one week or if persistent fever develops (Given a prescription to hold, with an expiration date)       ED Prescriptions    Medication Sig Dispense Auth. Provider   benzonatate (TESSALON) 200 MG capsule Take one cap by mouth at bedtime as needed for cough.  May repeat in 4 to 6 hours 15 capsule Lattie HawBeese, Nashalie Sallis A, MD   doxycycline (VIBRAMYCIN) 100 MG capsule Take 1 capsule (100 mg total) by mouth 2 (two) times daily. Take with food (Rx void after 06/06/18) 14 capsule Lattie HawBeese, Charity Tessier A, MD         Lattie HawBeese, Liseth Wann A, MD 05/29/18 [phone]

## 2018-09-26 ENCOUNTER — Other Ambulatory Visit: Payer: Self-pay

## 2018-09-26 ENCOUNTER — Encounter: Payer: Self-pay | Admitting: Emergency Medicine

## 2018-09-26 ENCOUNTER — Ambulatory Visit
Admission: EM | Admit: 2018-09-26 | Discharge: 2018-09-26 | Disposition: A | Discharge status: Home / Self Care | Payer: Medicaid Other

## 2018-09-26 DIAGNOSIS — S01511A Laceration without foreign body of lip, initial encounter: Secondary | ICD-10-CM

## 2018-09-26 DIAGNOSIS — Y9372 Activity, wrestling: Secondary | ICD-10-CM | POA: Diagnosis not present

## 2018-09-26 MED ORDER — CHLORHEXIDINE GLUCONATE 0.12 % MT SOLN: 15.0000 mL | Freq: Two times a day (BID) | OROMUCOSAL | 0 refills | Status: AC

## 2018-09-26 NOTE — ED Triage Notes (Signed)
Pt presents to [facility] for assessment after wrestling with his friend and caught a knee to mouth.  Lip is split on the inside.  Guardian has been putting peroxide in the mouth.

## 2018-09-26 NOTE — Discharge Instructions (Addendum)
No alarming sign on exam. Start peridex as directed. Ice compress daily. Oragel as needed. Follow up with PCP for further evaluation if symptoms not improving.

## 2018-09-26 NOTE — ED Provider Notes (Signed)
[name]    CSN: [id number] Arrival date & time: 09/26/18  1524     History   Chief Complaint Chief Complaint  Patient presents with  . Lip Laceration    HPI Jerry Casey. is a 23 y.o. male.   23 year old male comes in with guardian for 3-day history of lip laceration.  Patient was wrestling with his friend, and friends knee accidentally hit his mouth.  He has a laceration to the right lower inner gum that does not extend through.  He has been cleaning the laceration with peroxide.  Swelling has slightly improved.  Denies pain.  Denies loose tooth.  Guardian states, felt that the laceration looked worse, and came in for evaluation.  Patient has daily headaches at baseline without worsening.  Denies nausea/vomiting, confusion, weakness, dizziness, syncope.     Past Medical History:  Diagnosis Date  . Anxiety disorder   . Bardet-Biedl syndrome   . Obesity   . Premature birth   . Problems with learning   . Tic disorder     Patient Active Problem List   Diagnosis Date Noted  . 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 Murmur 02/24/2015  . Aggressive behavior   . 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 Palpitations 06/23/2011  . Chest pain 06/23/2011    Past Surgical History:  Procedure Laterality Date  . HERNIA REPAIR         Home Medications    Prior to Admission medications   Medication Sig Start Date End Date Taking? Authorizing Provider  metFORMIN (GLUCOPHAGE) 500 MG tablet Take by mouth daily with breakfast.   Yes [provider]  ARIPiprazole (ABILIFY) 10 MG tablet Take 10 mg by mouth daily.    [provider]  benzonatate (TESSALON) 200 MG capsule Take one cap by mouth at bedtime as needed for cough.  May repeat in 4 to 6 hours 05/29/18   [name], MD  chlorhexidine (PERIDEX) 0.12 % solution Use as directed 15 mLs in the mouth or throat 2 (two) times daily. 09/26/18   [name],  [name], PA-C  clonazePAM (KLONOPIN) 1 MG tablet Take 1 mg by mouth daily as needed (for irritability or anxiety).    [provider]  doxycycline (VIBRAMYCIN) 100 MG capsule Take 1 capsule (100 mg total) by mouth 2 (two) times daily. Take with food (Rx void after 06/06/18) 05/29/18   [name], MD  fenofibrate (TRICOR) 145 MG tablet Take 145 mg by mouth at bedtime.    [provider]  FLUoxetine (PROZAC) 20 MG capsule Take 20 mg by mouth every morning.    [provider]  ibuprofen (ADVIL,MOTRIN) 200 MG tablet Take 200 mg by mouth every 6 (six) hours as needed (for headaches).    [provider]  loratadine (CLARITIN) 10 MG tablet Take 10 mg by mouth daily as needed for allergies or rhinitis.     [provider]  Lutein-Zeaxanthin 20-0.8 MG CAPS Take 2 capsules by mouth daily.    [provider]  Omega-3 Fatty Acids (FISH OIL) 1000 MG CAPS Take 1,000 mg by mouth daily.    [provider]  ondansetron (ZOFRAN ODT) 4 MG disintegrating tablet Take 1 tablet (4 mg total) by mouth every 8 (eight) hours as needed for nausea or vomiting. 07/06/17   [name], [name], PA-C  oseltamivir (TAMIFLU) 75 MG capsule Take 1 capsule (75 mg total) by mouth every 12 (twelve) hours. 07/06/17  [name], Tinnie GensJeffrey, PA-C  traZODone (DESYREL) 50 MG tablet Take 50 mg by mouth at bedtime.    [provider]  VITAMIN A PO Take 7,200 mcg by mouth daily.    [provider]    Family History Family History  Problem Relation Age of Onset  . Heart disease Father        WPW  . Heart disease Mother        SVT    Social History Social History   Tobacco Use  . Smoking status: Never Smoker  . Smokeless tobacco: Never Used  Substance Use Topics  . Alcohol use: No    Alcohol/week: 0.0 standard drinks  . Drug use: No     Allergies   Penicillins and Sulfa antibiotics   Review of Systems Review of Systems  Reason unable to perform ROS: See  HPI as above.     Physical Exam Triage Vital Signs ED Triage Vitals [09/26/18 1532]  Enc Vitals Group     BP (!) 154/75     Pulse Rate 76     Resp 18     Temp 98 F (36.7 C)     Temp Source Oral     SpO2 96 %     Weight      Height      Head Circumference      Peak Flow      Pain Score 4     Pain Loc      Pain Edu?      Excl. in GC?    No data found.  Updated Vital Signs BP (!) 154/75 (BP Location: Left Arm)   Pulse 76   Temp 98 F (36.7 C) (Oral)   Resp 18   SpO2 96%   Physical Exam Constitutional:      General: He is not in acute distress.    Appearance: He is well-developed. He is not ill-appearing, toxic-appearing or diaphoretic.  HENT:     Head: Normocephalic and atraumatic.     Comments: 0.8cm V shaped laceration to right lower inner lip. Bleeding controlled. About 0.4cm in depth. No drainage. Mild tenderness to palpation. No loose tooth.  Eyes:     Conjunctiva/sclera: Conjunctivae normal.     Pupils: Pupils are equal, round, and reactive to light.  Neurological:     Mental Status: He is alert and oriented to person, place, and time.      UC Treatments / Results  Labs (all labs ordered are listed, but only abnormal results are displayed) Labs Reviewed - No data to display  EKG None  Radiology No results found.  Procedures Procedures (including critical care time)  Medications Ordered in UC Medications - No data to display  Initial Impression / Assessment and Plan / UC Course  I have reviewed the triage vital signs and the nursing notes.  Pertinent labs & imaging results that were available during my care of the patient were reviewed by me and considered in my medical decision making (see chart for details).    No alarming signs on exam. Peridex, oragel as needed. Return precautions given. Patient and guardian expresses understanding and agrees to plan.  Final Clinical Impressions(s) / UC Diagnoses   Final diagnoses:  Lip laceration,  initial encounter    ED Prescriptions    Medication Sig Dispense Auth. Provider   chlorhexidine (PERIDEX) 0.12 % solution Use as directed 15 mLs in the mouth or throat 2 (two) times daily. 473 mL [name], [name],  [name], [name], PA-C 09/26/18 1550

## 2018-09-26 NOTE — ED Notes (Signed)
Patient able to ambulate independently  

## 2019-05-31 ENCOUNTER — Ambulatory Visit: Payer: Medicaid Other | Attending: Internal Medicine

## 2019-05-31 DIAGNOSIS — Z20822 Contact with and (suspected) exposure to covid-19: Secondary | ICD-10-CM

## 2019-06-01 LAB — NOVEL CORONAVIRUS, NAA: SARS-CoV-2, NAA: NOT DETECTED

## 2019-06-13 IMAGING — DX DG CHEST 2V
2 series · 2 of 2 positions shown · non-contrast
Comparison: None.

CLINICAL DATA: Acute onset chest pain and vomiting today.

EXAM:
CHEST  2 VIEW

[w chest pa]
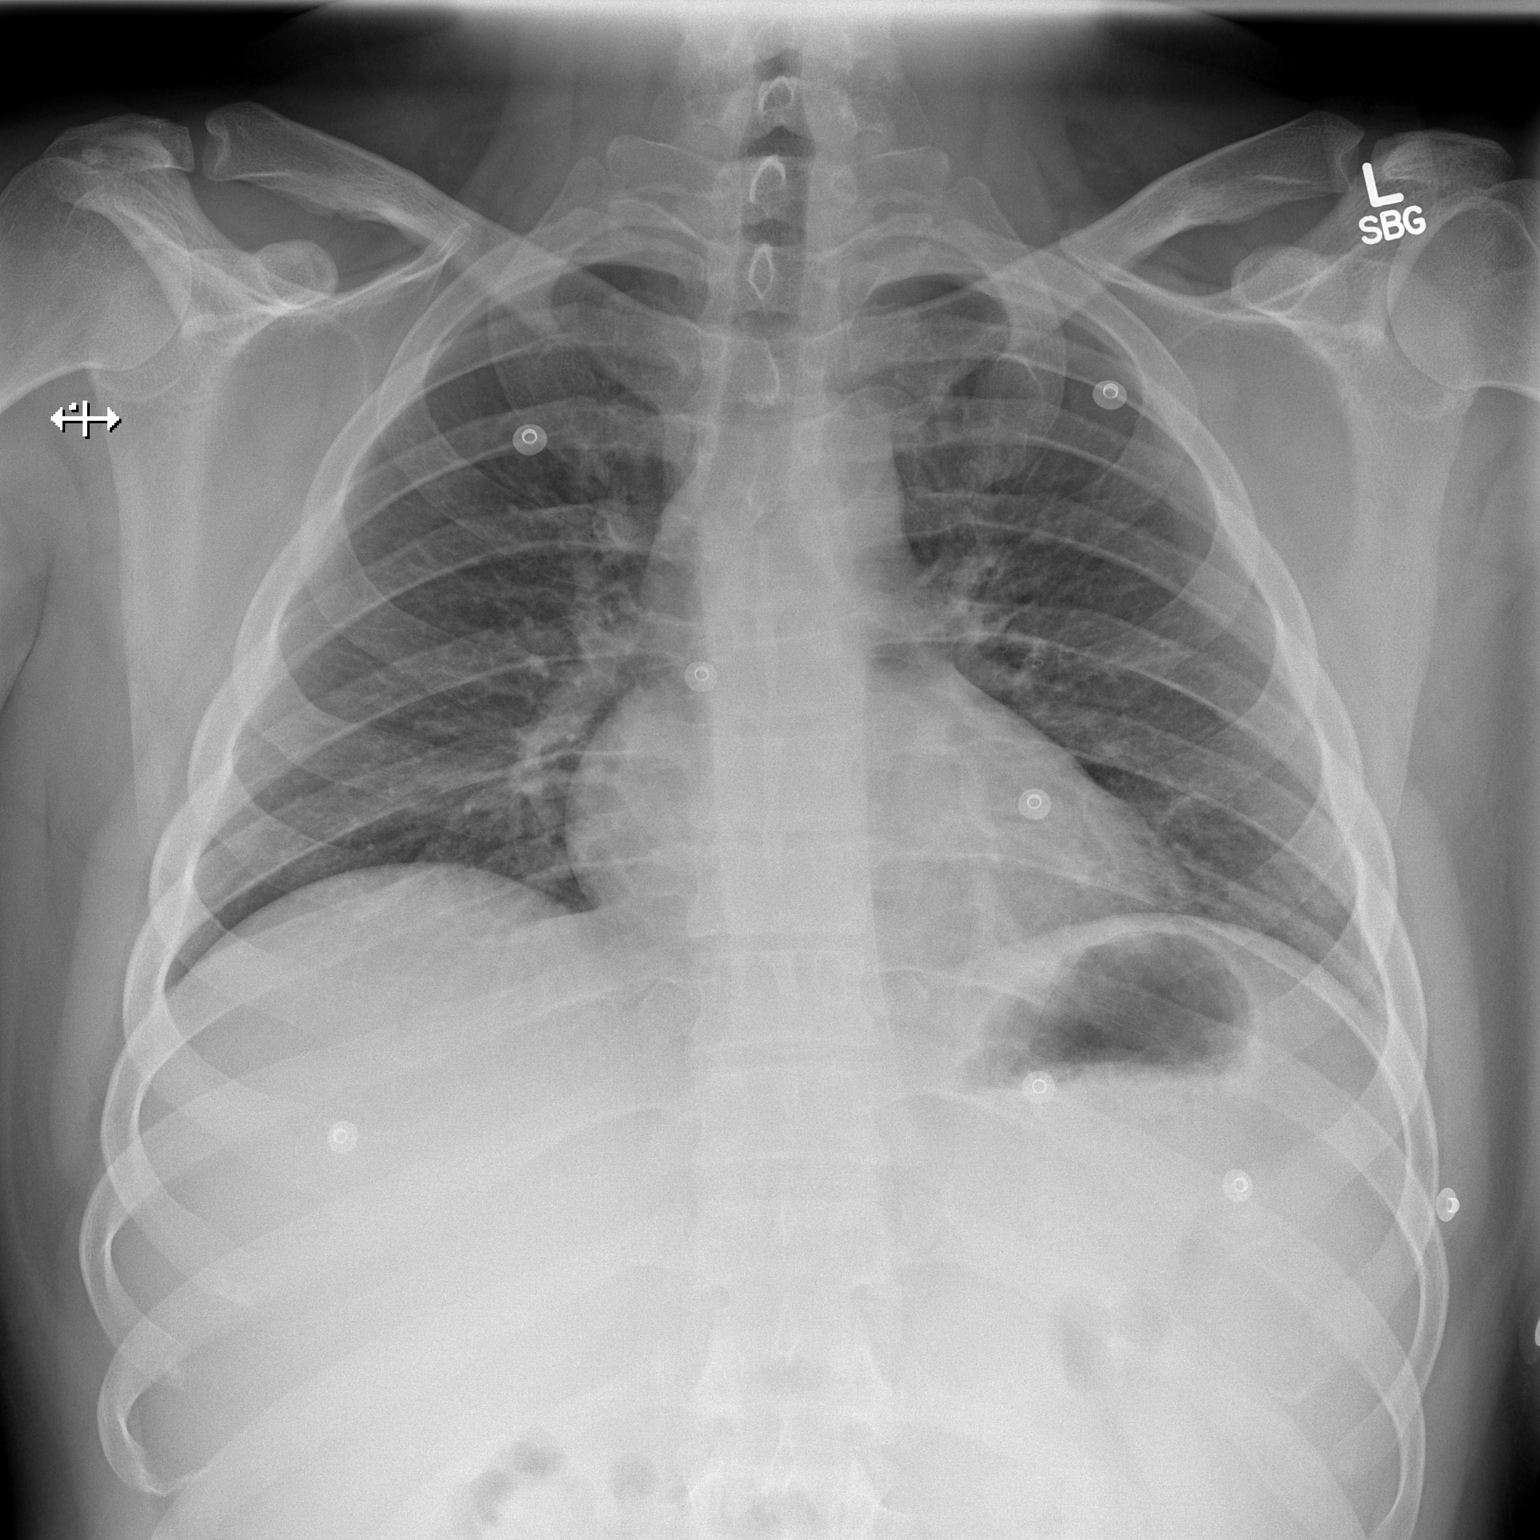

[w chest lat]
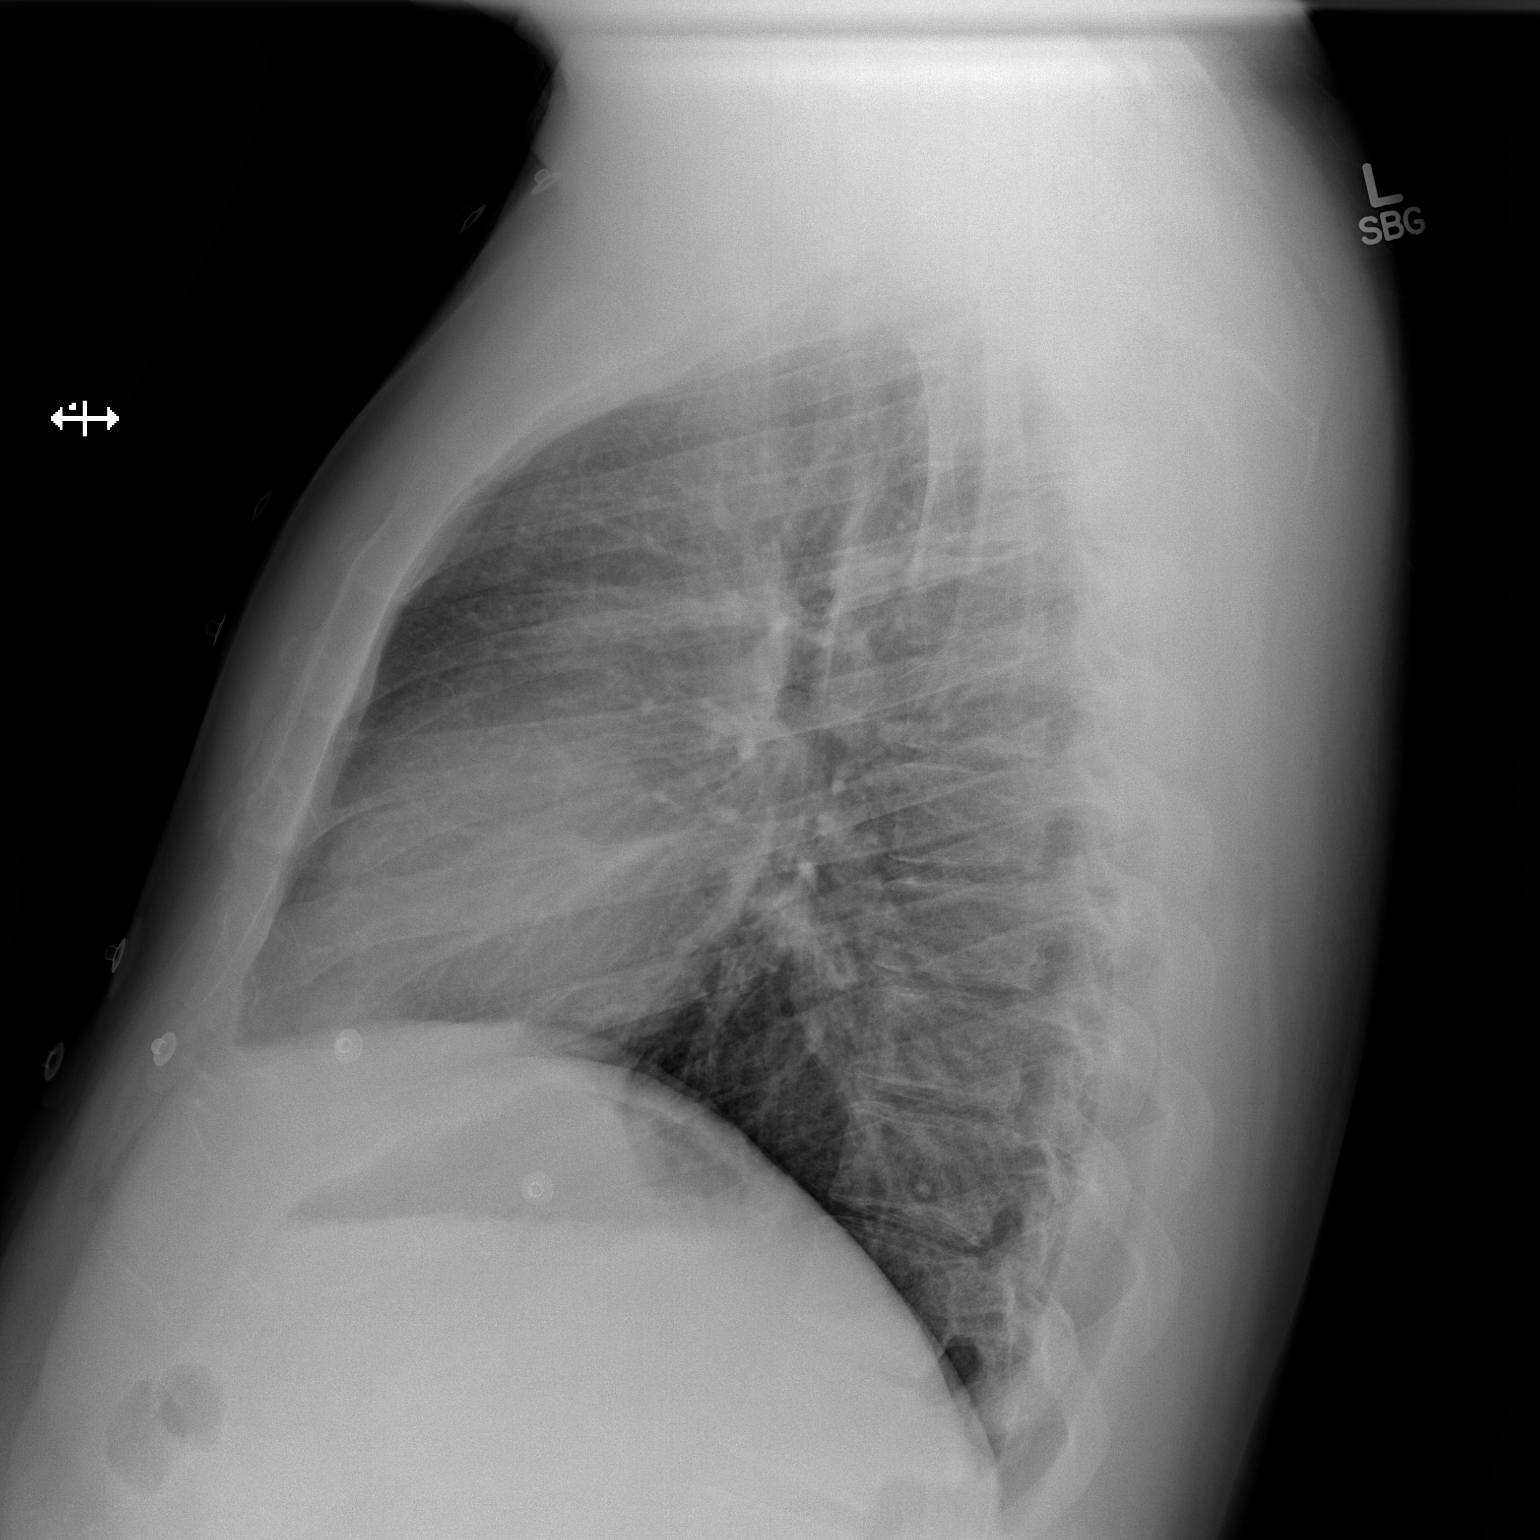

[2 of 2 positions shown; findings below may reference images not displayed]

FINDINGS: The heart size and mediastinal contours are within normal limits.
Both lungs are clear. The visualized skeletal structures are
unremarkable.
IMPRESSION: Negative.  No active cardiopulmonary disease.
